# Patient Record
Sex: Female | Born: 1960
Health system: Southern US, Community
[De-identification: ages and names within clinical notes are randomized; demographics above are authoritative.]

## PROBLEM LIST (undated history)

## (undated) DIAGNOSIS — G8929 Other chronic pain: Secondary | ICD-10-CM

## (undated) DIAGNOSIS — K0889 Other specified disorders of teeth and supporting structures: Secondary | ICD-10-CM

## (undated) DIAGNOSIS — Z9889 Other specified postprocedural states: Secondary | ICD-10-CM

## (undated) DIAGNOSIS — M549 Dorsalgia, unspecified: Secondary | ICD-10-CM

## (undated) DIAGNOSIS — G5602 Carpal tunnel syndrome, left upper limb: Secondary | ICD-10-CM

## (undated) DIAGNOSIS — M1990 Unspecified osteoarthritis, unspecified site: Secondary | ICD-10-CM

## (undated) DIAGNOSIS — S52502A Unspecified fracture of the lower end of left radius, initial encounter for closed fracture: Secondary | ICD-10-CM

## (undated) DIAGNOSIS — F329 Major depressive disorder, single episode, unspecified: Secondary | ICD-10-CM

## (undated) DIAGNOSIS — R112 Nausea with vomiting, unspecified: Secondary | ICD-10-CM

## (undated) DIAGNOSIS — I10 Essential (primary) hypertension: Secondary | ICD-10-CM

## (undated) DIAGNOSIS — F32A Depression, unspecified: Secondary | ICD-10-CM

## (undated) DIAGNOSIS — K219 Gastro-esophageal reflux disease without esophagitis: Secondary | ICD-10-CM

## (undated) HISTORY — PX: TEMPOROMANDIBULAR JOINT SURGERY: SHX35

## (undated) HISTORY — PX: ELBOW SURGERY: SHX618

## (undated) HISTORY — PX: HYSTEROSCOPY: SHX211

## (undated) HISTORY — PX: DILATION AND CURETTAGE OF UTERUS: SHX78

## (undated) HISTORY — PX: MANDIBLE FRACTURE SURGERY: SHX706

## (undated) HISTORY — PX: TONSILLECTOMY: SUR1361

## (undated) HISTORY — PX: NASAL SINUS SURGERY: SHX719

## (undated) HISTORY — PX: FOOT SURGERY: SHX648

---

## 1998-06-16 ENCOUNTER — Other Ambulatory Visit: Admission: RE | Admit: 1998-06-16 | Discharge: 1998-06-16 | Payer: Self-pay | Admitting: Obstetrics and Gynecology

## 1999-10-06 ENCOUNTER — Other Ambulatory Visit: Admission: RE | Admit: 1999-10-06 | Discharge: 1999-10-06 | Payer: Self-pay | Admitting: Obstetrics and Gynecology

## 2003-08-25 ENCOUNTER — Emergency Department (HOSPITAL_COMMUNITY): Admission: AD | Admit: 2003-08-25 | Discharge: 2003-08-25 | Payer: Self-pay | Admitting: Family Medicine

## 2004-01-08 ENCOUNTER — Other Ambulatory Visit: Admission: RE | Admit: 2004-01-08 | Discharge: 2004-01-08 | Payer: Self-pay | Admitting: Obstetrics and Gynecology

## 2005-08-22 ENCOUNTER — Other Ambulatory Visit: Admission: RE | Admit: 2005-08-22 | Discharge: 2005-08-22 | Payer: Self-pay | Admitting: Obstetrics and Gynecology

## 2005-09-26 ENCOUNTER — Encounter: Admission: RE | Admit: 2005-09-26 | Discharge: 2005-09-26 | Payer: Self-pay | Admitting: Obstetrics and Gynecology

## 2005-10-25 ENCOUNTER — Encounter: Admission: RE | Admit: 2005-10-25 | Discharge: 2005-10-25 | Payer: Self-pay | Admitting: Obstetrics and Gynecology

## 2005-11-08 ENCOUNTER — Ambulatory Visit: Payer: Self-pay | Admitting: Otolaryngology

## 2005-11-09 ENCOUNTER — Emergency Department: Payer: Self-pay | Admitting: Emergency Medicine

## 2005-11-24 ENCOUNTER — Encounter (INDEPENDENT_AMBULATORY_CARE_PROVIDER_SITE_OTHER): Payer: Self-pay | Admitting: *Deleted

## 2005-11-24 ENCOUNTER — Ambulatory Visit (HOSPITAL_COMMUNITY): Admission: RE | Admit: 2005-11-24 | Discharge: 2005-11-24 | Payer: Self-pay | Admitting: Obstetrics and Gynecology

## 2005-12-01 ENCOUNTER — Ambulatory Visit: Payer: Self-pay | Admitting: Otolaryngology

## 2006-01-04 ENCOUNTER — Other Ambulatory Visit (HOSPITAL_COMMUNITY): Admission: RE | Admit: 2006-01-04 | Discharge: 2006-02-03 | Payer: Self-pay | Admitting: Psychiatry

## 2006-01-04 ENCOUNTER — Ambulatory Visit: Payer: Self-pay | Admitting: Psychiatry

## 2006-05-16 ENCOUNTER — Emergency Department (HOSPITAL_COMMUNITY): Admission: EM | Admit: 2006-05-16 | Discharge: 2006-05-16 | Payer: Self-pay | Admitting: Emergency Medicine

## 2007-07-10 IMAGING — RF DG BARIUM SWALLOW
1 series · 15 of 24 positions shown · non-contrast
Comparison: none

REASON FOR EXAM: VOMITING. ESOPHAGEAL REFLUX
COMMENTS:

PROCEDURE:     FL  - FL BARIUM SWALLOW  - November 08, 2005  [DATE]
RESULT:     Esophageal mucosal pattern, peristaltic activity and contour are
normal.  No esophageal mass lesions are noted.  No evidence of hiatal hernia
or reflux.

[Series 1: run · 12 acquisitions, 15 frames shown]
[im 1/12]
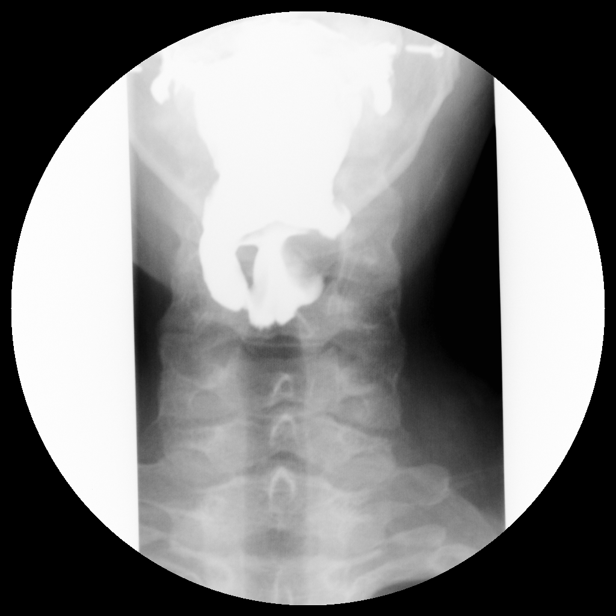
[im 1/12]
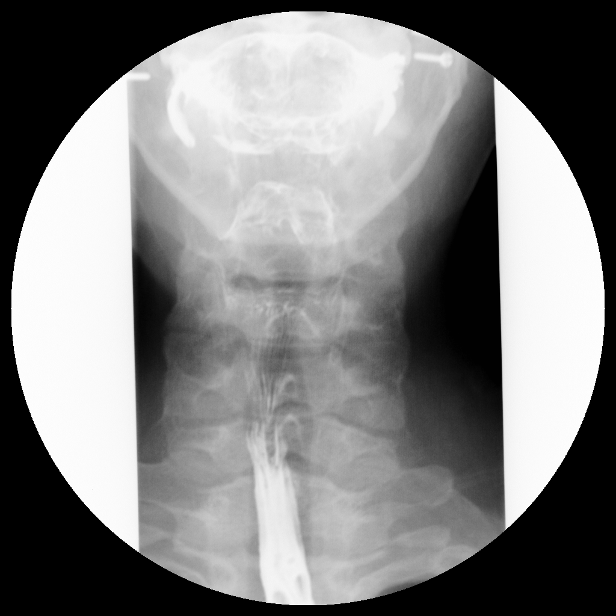
[im 2/12]
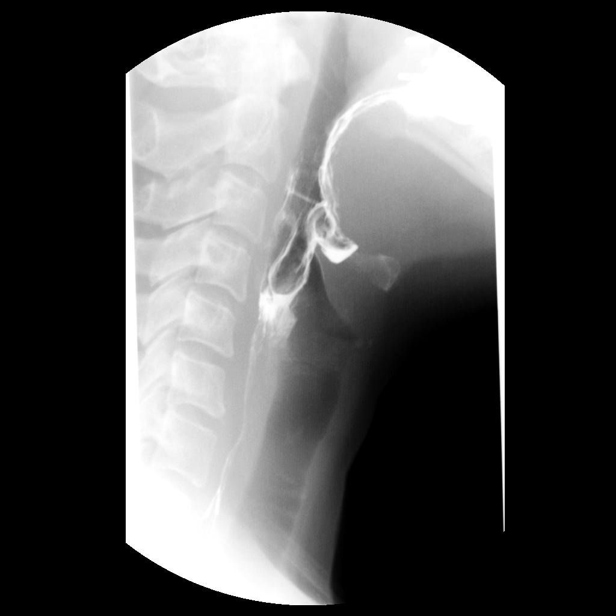
[im 2/12]
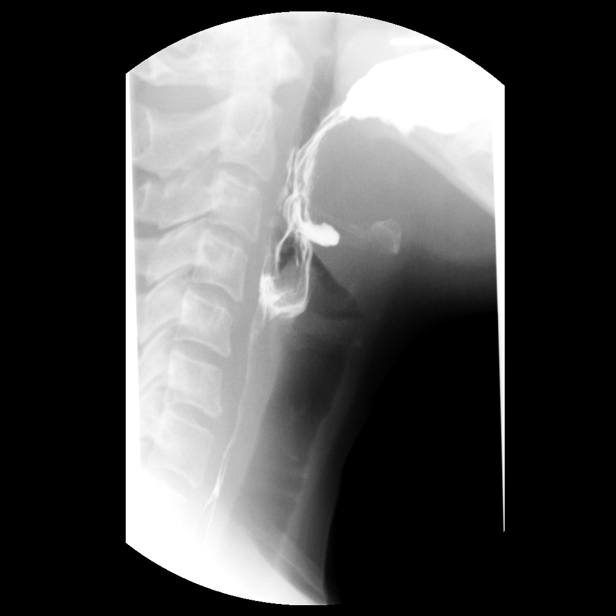
[im 2/12]
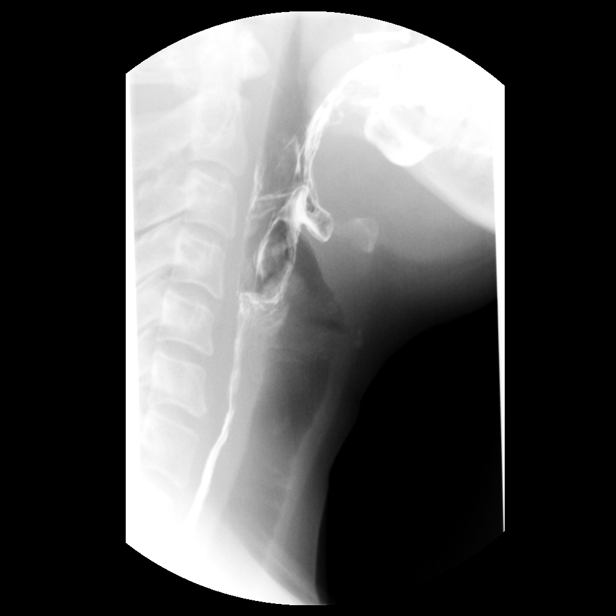
[im 3/12]
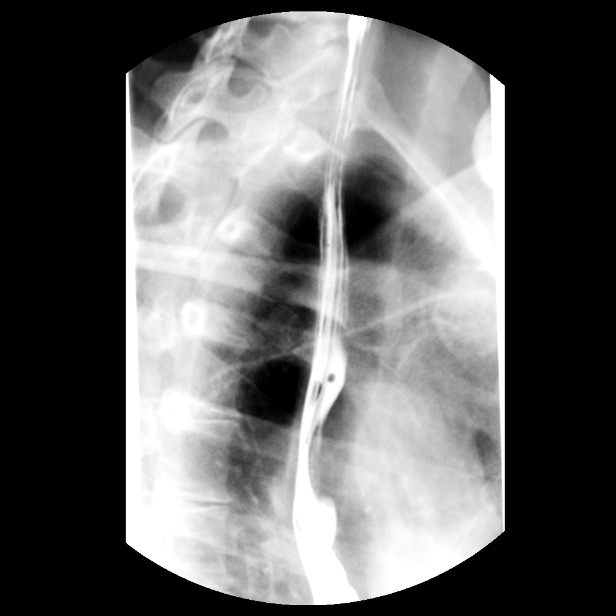
[im 4/12]
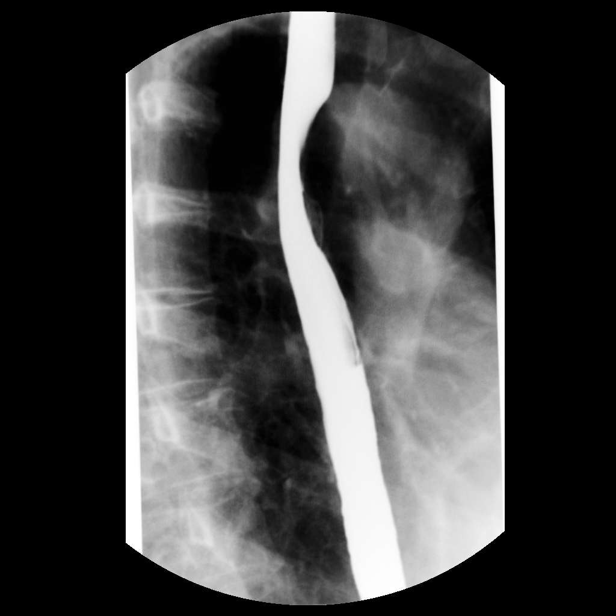
[im 6/12]
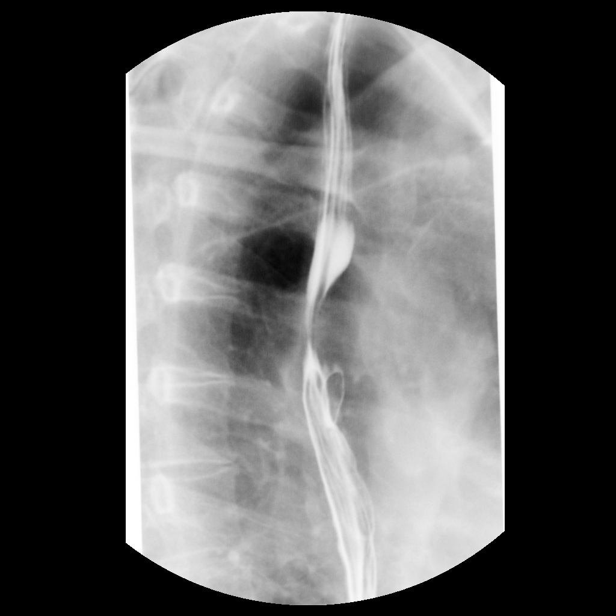
[im 7/12]
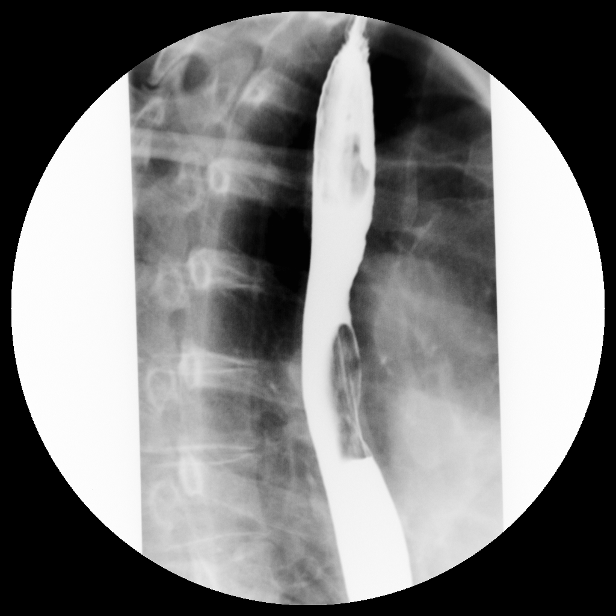
[im 8/12]
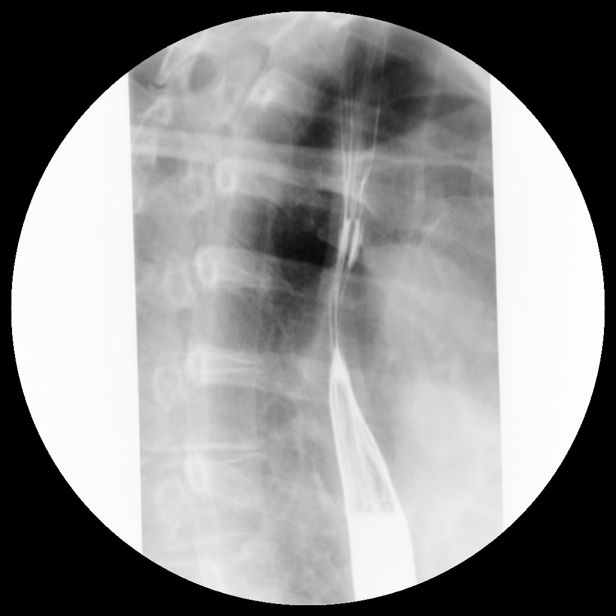
[im 8/12]
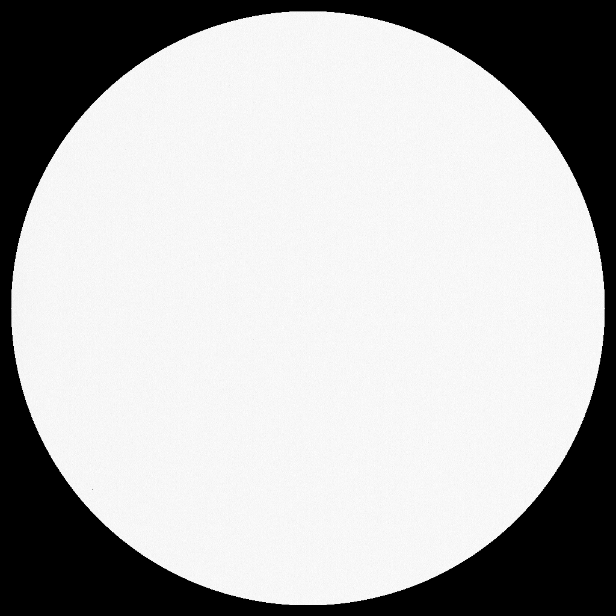
[im 9/12]
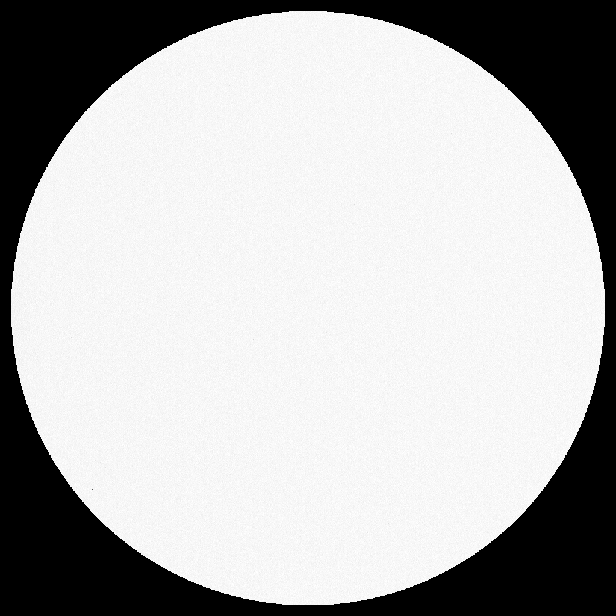
[im 10/12]
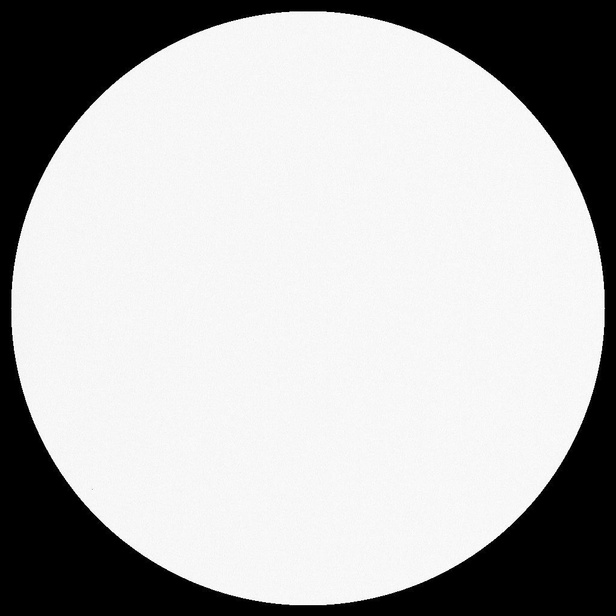
[im 11/12]
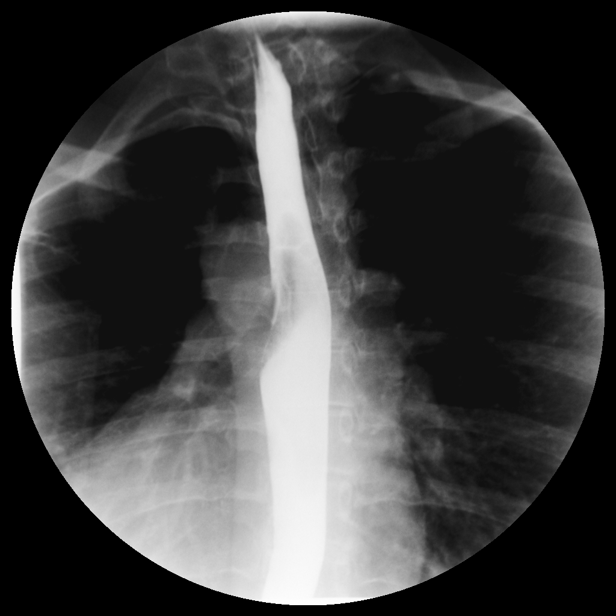
[im 12/12]
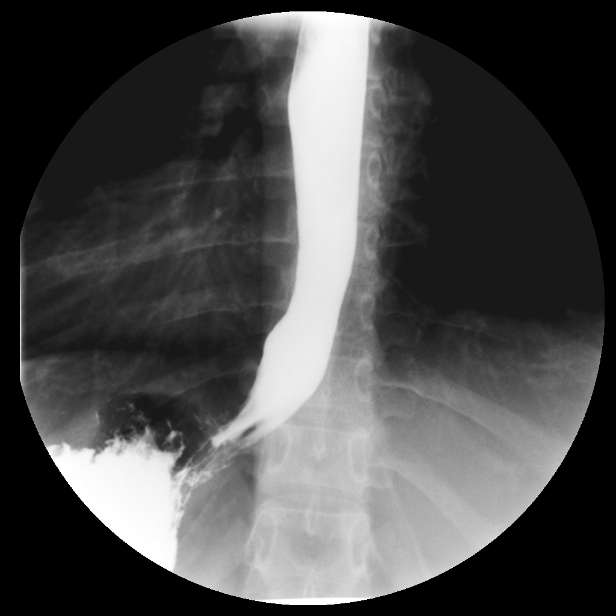

[15 of 24 positions shown; findings below may reference images not displayed]

IMPRESSION: Negative exam.

## 2008-11-24 ENCOUNTER — Encounter: Admission: RE | Admit: 2008-11-24 | Discharge: 2008-11-24 | Payer: Self-pay | Admitting: Orthopedic Surgery

## 2008-12-08 ENCOUNTER — Encounter: Admission: RE | Admit: 2008-12-08 | Discharge: 2008-12-08 | Payer: Self-pay | Admitting: Orthopedic Surgery

## 2008-12-22 ENCOUNTER — Encounter: Admission: RE | Admit: 2008-12-22 | Discharge: 2008-12-22 | Payer: Self-pay | Admitting: Orthopedic Surgery

## 2010-05-06 ENCOUNTER — Encounter: Admission: RE | Admit: 2010-05-06 | Discharge: 2010-05-06 | Payer: Self-pay | Admitting: Orthopedic Surgery

## 2010-05-12 ENCOUNTER — Observation Stay (HOSPITAL_COMMUNITY): Admission: EM | Admit: 2010-05-12 | Discharge: 2010-05-15 | Payer: Self-pay | Admitting: Emergency Medicine

## 2010-05-12 ENCOUNTER — Emergency Department (HOSPITAL_COMMUNITY): Admission: EM | Admit: 2010-05-12 | Discharge: 2010-05-12 | Payer: Self-pay | Admitting: Emergency Medicine

## 2010-05-20 ENCOUNTER — Ambulatory Visit (HOSPITAL_BASED_OUTPATIENT_CLINIC_OR_DEPARTMENT_OTHER): Admission: RE | Admit: 2010-05-20 | Discharge: 2010-05-21 | Payer: Self-pay | Admitting: Orthopedic Surgery

## 2010-10-10 ENCOUNTER — Encounter: Payer: Self-pay | Admitting: Obstetrics and Gynecology

## 2010-12-03 LAB — BASIC METABOLIC PANEL
BUN: 12 mg/dL (ref 6–23)
CO2: 29 mEq/L (ref 19–32)
Calcium: 9.4 mg/dL (ref 8.4–10.5)
Chloride: 103 mEq/L (ref 96–112)
Creatinine, Ser: 0.6 mg/dL (ref 0.4–1.2)
GFR calc Af Amer: >60 mL/min (ref >60)
GFR calc non Af Amer: >60 mL/min (ref >60)
Glucose, Bld: 103 mg/dL — ABNORMAL HIGH (ref 70–99)
Potassium: 4.2 mEq/L (ref 3.5–5.1)
Sodium: 137 mEq/L (ref 135–145)

## 2010-12-03 LAB — DIFFERENTIAL
Basophils Absolute: 0.1 10*3/uL (ref 0.0–0.1)
Basophils Relative: 1 % (ref 0–1)
Eosinophils Absolute: 0.2 10*3/uL (ref 0.0–0.7)
Eosinophils Relative: 3 % (ref 0–5)
Lymphocytes Relative: 23 % (ref 12–46)
Lymphs Abs: 1.6 10*3/uL (ref 0.7–4.0)
Monocytes Absolute: 0.6 10*3/uL (ref 0.1–1.0)
Monocytes Relative: 8 % (ref 3–12)
Neutro Abs: 4.7 10*3/uL (ref 1.7–7.7)
Neutrophils Relative %: 65 % (ref 43–77)

## 2010-12-03 LAB — CBC
HCT: 38.1 % (ref 36.0–46.0)
Hemoglobin: 13 g/dL (ref 12.0–15.0)
MCH: 33.6 pg (ref 26.0–34.0)
MCHC: 34.1 g/dL (ref 30.0–36.0)
MCV: 98.4 fL (ref 78.0–100.0)
Platelets: 301 10*3/uL (ref 150–400)
RBC: 3.87 MIL/uL (ref 3.87–5.11)
RDW: 12.6 % (ref 11.5–15.5)
WBC: 7.1 10*3/uL (ref 4.0–10.5)

## 2011-02-04 NOTE — Op Note (Signed)
[name], Alicia Rice                    ACCOUNT NO.:  [id number]   MEDICAL RECORD NO.:  [id number]          PATIENT TYPE:  AMB   LOCATION:  [facility]                           FACILITY:  [facility]   PHYSICIAN:  Hal Morales, M.D.DATE OF BIRTH:  1960/09/24   DATE OF PROCEDURE:  11/24/2005  DATE OF DISCHARGE:                                 OPERATIVE REPORT   PREOPERATIVE DIAGNOSIS:  Menorrhagia.   POSTOPERATIVE DIAGNOSIS:  Menorrhagia.   OPERATION:  Hysteroscopy, polyp removal and NovaSure endometrial ablation.   SURGEON:  Hal Morales, M.D.   ANESTHESIA:  Monitored anesthesia care and local.   ESTIMATED BLOOD LOSS:  Less than 25 mL.   COMPLICATIONS:  None.   FINDINGS:  The uterus sounded to 7 cm with a 2.5 cm cervical length. At the  time of hysteroscopy a small apparent polyp on the anterior surface of the  uterus was noted and this was removed with Randall stone forceps.   PROCEDURE:  The patient was taken to the operating room after appropriate  identification and placed on the operating table. After placement of  equipment for monitored anesthesia care, she was placed in the lithotomy  position. The perineum and vagina were prepped with multiple layers of  Betadine and draped in sterile field. A red Robinson catheter was used to  empty the bladder. A Graves speculum was placed in the vagina and a single-  tooth tenaculum placed on the anterior cervix. Paracervical block was  achieved with a total of 10 mL of 2% Xylocaine in the 5 and 7 o'clock  positions. The uterus was sounded. The cervix was measured. The cervix was  noted to be dilated enough to accept the diagnostic hysteroscope, and it was  used to document the above-noted findings. The Randall stone forceps were  used to remove the aforementioned polypoid lesion. The ostia were both  identified. Before the ostia could be both identified, suction of some of  the endometrial contents was undertaken with a #7 suction  curette. Once both  ostia were identified and the polypoid lesion had been removed, the NovaSure  apparatus was placed into the endometrial cavity with the endometrial length  of 4.5 cm having been set on the apparatus. The array was opened and seated  in the cavity with a maximum cavity width of 2.8 cm. Vaseline gauze was used  to seal the cervical opening and test for integrity of the cavity undertaken  and passed. The NovaSure ablation was then begun with a power of 69 in a  time of 1 minute 18 seconds. The NovaSure array was replaced into the  aperture and the entire instrument removed from the vagina. The tenaculum  was removed from the cervix and a suture placed in the  cervix to allow hemostasis at the site of the tenaculum stick. All  instruments were then removed from the vagina and the patient taken from the  operating room to the recovery room in satisfactory condition having  tolerated the procedure well with sponge and instrument counts correct.  Hal Morales, M.D.  Electronically Signed     VPH/MEDQ  D:  11/24/2005  T:  11/25/2005  Job:  [id number]

## 2011-02-04 NOTE — H&P (Signed)
NAMEERIKO, Rice                    ACCOUNT NO.:  [id number]   MEDICAL RECORD NO.:  [id number]          PATIENT TYPE:  AMB   LOCATION:  [facility]                           FACILITY:  [facility]   PHYSICIAN:  Hal Morales, M.D.DATE OF BIRTH:  04-26-1961   DATE OF ADMISSION:  DATE OF DISCHARGE:                                HISTORY & PHYSICAL   HISTORY OF PRESENT ILLNESS:  Ms.  Alicia Rice is a 50 year old married white female,  para 2-0-1-2, who presents for NovaSure endometrial ablation because of  menorrhagia.  For the past three years three of the patient's 7-day  menstrual flow requires a change of a tampon with a pad every 90 minutes,  throughout which time she also experiences clots and occasionally will soil  her clothing.  The patient has also experienced cramping which she rates as  7/10 on a 10-point scale, but finds adequate relief with Aleve.  A  sonohysterogram in May of 2005 was within normal limits and did not  demonstrate any submucosal fibroids or endometrial lesions.  A TSH and CBC  at that time were also normal.  Since the patient is intolerant of oral  contraceptives to manage her symptoms due to her history of hypertension and  migraines, she has chosen to undergo endometrial ablation to manage her  bleeding.   PAST MEDICAL/OB HISTORY:  Gravida 4, para 2-0-2-2.   GYN HISTORY:  Menarche 50 years old.  Last menstrual period November 09, 2005.  The patient uses vasectomy as her method of contraception.  She  denies any history of abnormal Pap smear or sexually transmitted diseases.  The patient's last mammogram was January of 2007 which was accompanied by an  ultrasound, during which time an abnormality was discovered for which the  patient will follow up in six months (nonspecific abnormality of the  breast).  Pap smear December 2006 was within normal limits.   PAST MEDICAL HISTORY:  1.  Hypertension.  2.  Migraines.  3.  Depression.  4.  Anxiety.  5.  Gastroesophageal  reflux disease.  6.  Attention deficit disorder.   PAST SURGICAL HISTORY:  1.  1965, 1968, and 1974, urethral stretching.  2.  1981, tonsillectomy.  3.  1991 and 1993, TMJ surgery.  4.  2005, right elbow surgery.  The patient states that anesthesia causes      her severe nausea and vomiting.  She denies any history of blood      transfusions.   FAMILY HISTORY:  Graves' disease, hypertension, noninsulin dependent  diabetes, and asthma.   SOCIAL HISTORY:  She is married and she works as a IT consultant.   HABITS:  She does not use tobacco or alcohol.   CURRENT MEDICATIONS:  1.  Wellbutrin XL 300 mg one tablet every morning.  2.  Quinapril 10 mg one tablet every morning.  3.  Adderall XR 30 mg one tablet every morning.  4.  Ranitidine 150 mg one tablet twice daily.  5.  AcipHex 20 mg one tablet twice daily.  6.  Lexapro 20 mg one tablet  at bedtime.  7.  Ambien CR 12.5 mg one tablet as needed.  8.  Alprazolam 1 mg one half tablet four times daily as needed.   ALLERGIES:  The patient has no known drug allergies.   REVIEW OF SYSTEMS:  The patient does have insomnia.  She denies any  vaginitis symptoms, fever, urinary tract symptoms, change in bowel habits,  or dyspareunia, and otherwise except as mentioned in history of present  illness, her review of systems is negative.   PHYSICAL EXAMINATION:  VITAL SIGNS:  Blood pressure 120/74, pulse is 80 and  regular, height 5 feet 6 inches tall, weight 133 pounds.  NECK:  Supple without masses.  There is no thyromegaly or adenopathy.  HEART:  Regular rate and rhythm.  There is no murmur.  LUNGS:  Clear to auscultation.  There are no wheezes, rales, or rhonchi.  BACK:  No CVA tenderness.  ABDOMEN:  Bowel sounds are present.  It is soft without tenderness,  guarding, rebound, or organomegaly.  EXTREMITIES:  Without clubbing, cyanosis, or edema.  PELVIC:  EGBUS is within normal limits.  Vagina is rugous.  Cervix is  nontender without lesions.   Uterus appears normal size, shape, and  consistency without tenderness.  Her uterus sounds to 9 cm.  Adnexa without  tenderness or masses.  Rectovaginal without tenderness or masses.  An  endometrial biopsy was performed at the time of the patient's preoperative  visit.  The pathology report revealed benign early secretory phase  endometrium.  No hyperplasia identified.   IMPRESSION:  Menorrhagia.   DISPOSITION:  A discussion was held with the patient regarding the  indications for her NovaSure endometrial ablation procedure along with the  risks which include, but are not limited to, reaction to anesthesia, damage  to adjacent organs, excessive bleeding, and infection.  The patient has  consented to proceed with the endometrial ablation procedure at [facility] [place] on November 24, 2005, at 11 a.m.      [name].      Hal Morales, M.D.  Electronically Signed    EJP/MEDQ  D:  11/22/2005  T:  11/22/2005  Job:  [id number]

## 2012-02-22 ENCOUNTER — Encounter (HOSPITAL_COMMUNITY): Payer: Self-pay | Admitting: Emergency Medicine

## 2012-02-22 ENCOUNTER — Emergency Department (HOSPITAL_COMMUNITY)
Admission: EM | Admit: 2012-02-22 | Discharge: 2012-02-23 | Disposition: A | Discharge status: Home / Self Care | Payer: BC Managed Care – PPO | Attending: Emergency Medicine | Admitting: Emergency Medicine

## 2012-02-22 DIAGNOSIS — R1031 Right lower quadrant pain: Secondary | ICD-10-CM | POA: Insufficient documentation

## 2012-02-22 DIAGNOSIS — K5730 Diverticulosis of large intestine without perforation or abscess without bleeding: Secondary | ICD-10-CM | POA: Insufficient documentation

## 2012-02-22 DIAGNOSIS — X503XXA Overexertion from repetitive movements, initial encounter: Secondary | ICD-10-CM | POA: Insufficient documentation

## 2012-02-22 DIAGNOSIS — IMO0002 Reserved for concepts with insufficient information to code with codable children: Secondary | ICD-10-CM | POA: Insufficient documentation

## 2012-02-22 DIAGNOSIS — H9190 Unspecified hearing loss, unspecified ear: Secondary | ICD-10-CM | POA: Insufficient documentation

## 2012-02-22 DIAGNOSIS — H9319 Tinnitus, unspecified ear: Secondary | ICD-10-CM | POA: Insufficient documentation

## 2012-02-22 DIAGNOSIS — Z79899 Other long term (current) drug therapy: Secondary | ICD-10-CM | POA: Insufficient documentation

## 2012-02-22 DIAGNOSIS — I10 Essential (primary) hypertension: Secondary | ICD-10-CM | POA: Insufficient documentation

## 2012-02-22 DIAGNOSIS — S39011A Strain of muscle, fascia and tendon of abdomen, initial encounter: Secondary | ICD-10-CM

## 2012-02-22 HISTORY — DX: Essential (primary) hypertension: I10

## 2012-02-22 LAB — CBC
HCT: 40.6 % (ref 36.0–46.0)
Hemoglobin: 14.1 g/dL (ref 12.0–15.0)
MCH: 33 pg (ref 26.0–34.0)
MCHC: 34.7 g/dL (ref 30.0–36.0)
MCV: 95.1 fL (ref 78.0–100.0)
Platelets: 294 10*3/uL (ref 150–400)
RBC: 4.27 MIL/uL (ref 3.87–5.11)
RDW: 14 % (ref 11.5–15.5)
WBC: 8 10*3/uL (ref 4.0–10.5)

## 2012-02-22 LAB — DIFFERENTIAL
Basophils Absolute: 0.1 10*3/uL (ref 0.0–0.1)
Basophils Relative: 1 % (ref 0–1)
Eosinophils Absolute: 0.2 10*3/uL (ref 0.0–0.7)
Eosinophils Relative: 3 % (ref 0–5)
Lymphocytes Relative: 32 % (ref 12–46)
Lymphs Abs: 2.6 10*3/uL (ref 0.7–4.0)
Monocytes Absolute: 0.6 10*3/uL (ref 0.1–1.0)
Monocytes Relative: 7 % (ref 3–12)
Neutro Abs: 4.6 10*3/uL (ref 1.7–7.7)
Neutrophils Relative %: 58 % (ref 43–77)

## 2012-02-22 NOTE — ED Notes (Signed)
PT. REPORTS RIGHT LOWER BACK AND RLQ PAIN ONSET YESTERDAY AFTER PICKING UP A BOX , STATES " STATES FELT FUNNY".  SLIGHT NAUSEA .

## 2012-02-23 ENCOUNTER — Emergency Department (HOSPITAL_COMMUNITY): Payer: BC Managed Care – PPO

## 2012-02-23 LAB — BASIC METABOLIC PANEL
BUN: 28 mg/dL — ABNORMAL HIGH (ref 6–23)
CO2: 26 mEq/L (ref 19–32)
Calcium: 9.3 mg/dL (ref 8.4–10.5)
Chloride: 102 mEq/L (ref 96–112)
Creatinine, Ser: 0.83 mg/dL (ref 0.50–1.10)
GFR calc Af Amer: >90 mL/min (ref >90)
GFR calc non Af Amer: 81 mL/min — ABNORMAL LOW (ref >90)
Glucose, Bld: 117 mg/dL — ABNORMAL HIGH (ref 70–99)
Potassium: 3.7 mEq/L (ref 3.5–5.1)
Sodium: 141 mEq/L (ref 135–145)

## 2012-02-23 LAB — URINALYSIS, ROUTINE W REFLEX MICROSCOPIC
Bilirubin Urine: NEGATIVE
Glucose, UA: NEGATIVE mg/dL
Hgb urine dipstick: NEGATIVE
Ketones, ur: NEGATIVE mg/dL
Leukocytes, UA: NEGATIVE
Nitrite: NEGATIVE
Protein, ur: NEGATIVE mg/dL
Specific Gravity, Urine: 1.029 (ref 1.005–1.030)
Urobilinogen, UA: 0.2 mg/dL (ref 0.0–1.0)
pH: 6 (ref 5.0–8.0)

## 2012-02-23 MED ORDER — POLYETHYLENE GLYCOL 3350 17 G PO PACK: 17.0000 g | PACK | Freq: Every day | ORAL | Status: AC

## 2012-02-23 MED ORDER — IOHEXOL 300 MG/ML  SOLN
100.0000 mL | Freq: Once | INTRAMUSCULAR | Status: AC | PRN
  Administered 2012-02-23: 100 mL via INTRAVENOUS

## 2012-02-23 MED ORDER — DIAZEPAM 5 MG PO TABS: 5.0000 mg | ORAL_TABLET | Freq: Three times a day (TID) | ORAL | Status: AC | PRN

## 2012-02-23 MED ORDER — IOHEXOL 300 MG/ML  SOLN
20.0000 mL | INTRAMUSCULAR | Status: AC
  Administered 2012-02-23: 20 mL via ORAL

## 2012-02-23 MED ORDER — MORPHINE SULFATE 4 MG/ML IJ SOLN
4.0000 mg | Freq: Once | INTRAMUSCULAR | Status: AC
  Administered 2012-02-23: 4 mg via INTRAVENOUS
  Filled 2012-02-23: qty 1

## 2012-02-23 MED ORDER — IBUPROFEN 800 MG PO TABS: 800.0000 mg | ORAL_TABLET | Freq: Three times a day (TID) | ORAL | Status: AC

## 2012-02-23 MED ORDER — OXYCODONE-ACETAMINOPHEN 5-325 MG PO TABS: 2.0000 | ORAL_TABLET | ORAL | Status: AC | PRN

## 2012-02-23 MED ORDER — DIAZEPAM 5 MG PO TABS
5.0000 mg | ORAL_TABLET | Freq: Once | ORAL | Status: AC
  Administered 2012-02-23: 5 mg via ORAL
  Filled 2012-02-23: qty 1

## 2012-02-23 MED ORDER — KETOROLAC TROMETHAMINE 30 MG/ML IJ SOLN
30.0000 mg | Freq: Once | INTRAMUSCULAR | Status: AC
  Administered 2012-02-23: 30 mg via INTRAVENOUS
  Filled 2012-02-23: qty 1

## 2012-02-23 NOTE — ED Notes (Signed)
Pt c/o constant back pain that radiates to RLQ. Pain relived by nothing, and exacerbated by nothing. Denies difficulty urinating, or hx of kidney stones. States pain started after lifting objects.

## 2012-02-23 NOTE — ED Provider Notes (Signed)
History     CSN: [id number]  Arrival date & time 02/22/12  2312   First MD Initiated Contact with Patient 02/23/12 0252      Chief Complaint  Patient presents with  . Back Pain    (Consider location/radiation/quality/duration/timing/severity/associated sxs/prior treatment) Patient is a 51 y.o. female presenting with back pain.  Back Pain    51 year old female presents emergency department with right back and right lower quadrant abdominal pain. Patient reports sprains started yesterday after picking up a box weighing approximately 53 pounds. Patient reports when she lifted the box she also did some twisting to get moved where needed to go. Patient had some pain at that time, but since that episode of lifting and twisting she has had worsening pain. Pain is worse with flexion at her right hip, and with any twisting motion. Patient has taken ibuprofen without improvement in symptoms. No prior history of similar symptoms. Patient has noticed no bulging or deformity to the area of pain Past Medical History  Diagnosis Date  . Hypertension     Past Surgical History  Procedure Date  . Foot surgery   . Mandible fracture surgery     No family history on file.  History  Substance Use Topics  . Smoking status: Current Everyday Smoker  . Smokeless tobacco: Not on file  . Alcohol Use: No    OB History    Grav Para Term Preterm Abortions TAB SAB Ect Mult Living                  Review of Systems  HENT: Positive for hearing loss and tinnitus.   Musculoskeletal: Positive for back pain.  All other systems reviewed and are negative.   other than listed in the history of present illness  Allergies  Review of patient's allergies indicates no known allergies.  Home Medications   Current Outpatient Rx  Name Route Sig Dispense Refill  . RANITIDINE HCL 150 MG PO CAPS Oral Take 150 mg by mouth daily.    . SERTRALINE HCL 100 MG PO TABS Oral Take 100 mg by mouth daily.    Marland Kitchen DIAZEPAM  5 MG PO TABS Oral Take 1 tablet (5 mg total) by mouth every 8 (eight) hours as needed for anxiety (muscle spasm). 15 tablet 0  . IBUPROFEN 800 MG PO TABS Oral Take 1 tablet (800 mg total) by mouth 3 (three) times daily. 21 tablet 0  . OXYCODONE-ACETAMINOPHEN 5-325 MG PO TABS Oral Take 2 tablets by mouth every 4 (four) hours as needed for pain. 20 tablet 0  . POLYETHYLENE GLYCOL 3350 PO PACK Oral Take 17 g by mouth daily. 14 each 0    BP 150/93  Pulse 60  Temp(Src) 97.7 F (36.5 C) (Oral)  Resp 16  SpO2 99%  Physical Exam  Nursing note and vitals reviewed. Constitutional: She is oriented to person, place, and time. She appears well-developed and well-nourished.  HENT:  Head: Normocephalic and atraumatic.  Right Ear: External ear normal.  Left Ear: External ear normal.  Nose: Nose normal.  Mouth/Throat: Oropharynx is clear and moist.  Eyes: Conjunctivae and EOM are normal. Pupils are equal, round, and reactive to light.  Neck: Normal range of motion. Neck supple. No JVD present. No tracheal deviation present. No thyromegaly present.  Cardiovascular: Normal rate, regular rhythm, normal heart sounds and intact distal pulses.  Exam reveals no gallop and no friction rub.   No murmur heard. Pulmonary/Chest: Effort normal and breath sounds normal. No  stridor. No respiratory distress. She has no wheezes. She has no rales. She exhibits no tenderness.  Abdominal: Soft. Bowel sounds are normal. She exhibits no distension and no mass. There is tenderness (tenderness with palpation and right lower quadrant. No masses or hernias noted, but slight irregularity to palpation in her right lower abdomen.). There is no rebound and no guarding.  Musculoskeletal: Normal range of motion. She exhibits tenderness (mild tenderness of right paraspinal muscles, tenderness with straight leg raise of right leg in her right lower abdomen). She exhibits no edema.  Lymphadenopathy:    She has no cervical adenopathy.    Neurological: She is oriented to person, place, and time. She has normal reflexes. No cranial nerve deficit. She exhibits normal muscle tone. Coordination normal.  Skin: Skin is dry. No rash noted. No erythema. No pallor.  Psychiatric: She has a normal mood and affect. Her behavior is normal. Judgment and thought content normal.    ED Course  Procedures (including critical care time)  Labs Reviewed  BASIC METABOLIC PANEL - Abnormal; Notable for the following:    Glucose, Bld 117 (*)    BUN 28 (*)    GFR calc non Af Amer 81 (*)    All other components within normal limits  URINALYSIS, ROUTINE W REFLEX MICROSCOPIC  CBC  DIFFERENTIAL  CBC   Ct Abdomen Pelvis W Contrast  02/23/2012  *RADIOLOGY REPORT*  Clinical Data: Lower quadrant abdominal pain.  CT ABDOMEN AND PELVIS WITH CONTRAST  Technique:  Multidetector CT imaging of the abdomen and pelvis was performed following the standard protocol during bolus administration of intravenous contrast.  Contrast: OMNIPAQUE IOHEXOL 300 MG/ML  SOLN  Comparison: None  Findings: The lung bases are clear.  A cystic air space is noted on the left.  A 4 mm nodule is noted at the left lung base on image number 11.  No other pulmonary nodules.  No pleural effusion.  The liver is unremarkable.  No focal lesions or biliary dilatation. The gallbladder is normal.  No common bile duct dilatation.  The pancreas is normal.  The spleen is normal.  There is a 17 mm solid appearing nodule in the left upper quadrant located between the lateral limb of the adrenal gland, the spleen and the posterior aspect of the fundal region of the stomach.  This could be a gastric diverticulum or an exophytic splenic or adrenal gland lesion.  The right adrenal gland and both kidneys are normal.  A left renal cyst is noted.  The stomach, duodenum, small bowel and colon are unremarkable. There is moderate stool throughout the colon.  Diverticulosis of the sigmoid colon is noted.  No  mesenteric or retroperitoneal mass or adenopathy.  The aorta is normal in caliber.  The major branch vessels are patent.  The uterus and ovaries are normal.  The bladder is normal.  No pelvic mass, adenopathy or free pelvic fluid collections.  No inguinal mass or hernia.  The bony structures are unremarkable.  IMPRESSION:  1.  4 mm left lower lobe pulmonary nodule.  Recommend noncontrast chest CT follow-up in 6 months. 2.  17 mm left upper quadrant lesion as discussed above.  This could also be followed on the follow up CT scan. 3.  Diverticulosis of the sigmoid colon but no findings for diverticulitis.  Original Report Authenticated By: P. Loralie Champagne, M.D.     1. Abdominal muscle strain       MDM  51 year old female with right lower  abdominal pain concerning for possible hernia versus muscle strain. We'll get CT of abdomen pelvis. Patient without focal neurologic deficits or red flags for significant back pain. Will treat for pain with Valium and Toradol and morphine.        Olivia Mackie, MD 02/23/12 [phone]

## 2012-02-23 NOTE — Discharge Instructions (Signed)
Muscle Spasm  Take medications as prescribed.  Warm moist heat, either from warm bath, hot water bottle, or heating pad to the spasm area will help with symptoms.  Expect to be sore for 7-10 days.  Follow up with your regular doctor.  If you do not have a doctor, follow up with one of the numbers listed.  Return to the ER for worsening pain, new weakness, numbness, loss of bowel or bladder control, or other concerning symptoms.  PSYCH ANXIOLYTICS BENZODIAZEPINES  PSYCH ANXIOLYTICS BENZODIAZEPINES: You have been prescribed a medication that belongs to a class called Benzodiazepines.      These medicines are used to treat anxiety (nervousness), tremors, seizures, vertigo, insomnia, nausea (especially that associated with chemotherapy), alcohol or sedative drug withdrawal, and muscle spasm; they may also be given (usually intravenously) in the ED for sedation during procedures. This medication is a "scheduled substance" that means it is against the law to share it or give it to anyone else.     You have been given a medication, or a prescription for a medication, that causes drowsiness or dizziness.  DO NOT drive a car, operate machinery, ride a bike, or perform jobs that require you to be alert until you know how you are going to react to this medicine.     Make sure your doctor knows if you have any of these conditions before you take this medication:  an alcohol or drug abuse problem, depression, glaucoma, kidney or liver disease, lung disease or breathing difficulties, myasthenia gravis, Parkinson's disease.     If you are on any of the following medications make sure that your doctor knows before you start this medication as they can cause some undesirable interactions:  Seizure medicines (used for convulsion or epilepsy), chloroquine, cimetidine (Tagamet), digoxin (Lanoxin), disulfiram (Antabuse), or erythromycin.     DO NOT drink alcoholic beverages while taking this medicine.     If you  become dizzy, sit or lie down at the first signs.  You should be careful going up and down stairs.     This medication may cause side-effects.  If they are bothersome, discontinue the medication.  If they are severe, follow-up with your physician or return to the Emergency Department for a recheck.  These side-effects include:  dizziness, depression, headaches, blurry vision, and problems sleeping. Tell your doctor if you are taking any of the following medicines:      DO NOT drink alcoholic beverages while taking this medicine.     Medications for your stomach, Cyclosporin, Medications for your heart or blood pressure, Diflucan, Theophylline, Isoniazid, Antibiotics, Migraine medicines, Medications for seizures, depression, or mental illness.     If you become dizzy, sit or lie down at the first signs.  You should be careful going up and down stairs. DO NOT take more of this medicine than prescribed.  Taking too much of this medicine can cause DEATH.      If you miss a dose do not "double up."  DO NOT take extra doses as this will not help you feel any better any faster.  It may even cause unwanted side-effects.     Contact your doctor immediately if you develop an allergic reaction, you feel lightheaded, confused, drowsy, or experience thoughts of hurting yourself or others.  Call also if you experience yellowing of the eyes or skin, or abnormal muscle twitching or movements.     DO NOT take this medication if you are pregnant or are nursing  or you are actively trying to become pregnant.     Keep this medication out of the reach of children.  Always keep this medication in child-proof containers.  DO NOT give your medication to anyone else. This drug may be habit-forming (addictive).  DO NOT use it for more than one week without discussing it with your regular doctor.  You have been given a medication, or a prescription for a medication, that causes drowsiness or dizziness.  DO NOT drive a  car, operate machinery, ride a bike, or perform jobs that require you to be alert until you know how you are going to react to this medicine.  THESE INSTRUCTIONS ARE NOT COMPREHENSIVE (complete):  Ask your pharmacist for additional information and precautions for this medication.   PAIN NSAID MOTRIN  PAIN NSAID MOTRIN: You have been given a medication that contains ibuprofen.     This medication is often used to relieve pain, reduce fever, reduce inflammation, or to help prevent the ureteral spasm and pain associated with kidney stones.    DO NOT take this medication if you  have stomach ulcers or are sensitive / allergic to ibuprofen.    DO NOT take this medication if you are taking other over-the-counter medications that contain ibuprofen.  Never take more of the medication than prescribed.  Overdosing of medication may cause damage to your kidneys.    If you have side-effects that you think are caused by this medicine, tell your doctor.  If you develop stomach pain, vomit blood, or have bowel movements that become black and tarry, discontinue the medication and notify your physician immediately.    This medication may upset your stomach.  Always take medication with milk or meals.    Keep this medication out of the reach of children.  Always keep this medication in child-proof containers.  DO NOT give your medication to anyone else. THESE INSTRUCTIONS ARE NOT COMPREHENSIVE (complete):  Ask your pharmacist for additional information and precautions for this medication.   PAIN ACETAMINOPHEN OXYCODONE  PAIN ACETAMINOPHEN OXYCODONE: You have been given a medication that contains acetaminophen and oxycodone.      This medication is used to relieve pain.     DO NOT take this medication if you have liver disease or drink alcohol on a daily basis.     DO NOT take this medication if you are taking other over-the-counter medications that contain Tylenol or acetaminophen (the active  ingredient in Tylenol).     If you have side-effects that you think are caused by this medicine, tell your doctor.     DO NOT drink alcoholic beverages while taking this medicine.     If you become dizzy, sit or lie down at the first signs.  You should be careful going up and down stairs.     If you are pregnant or breastfeeding, notify your doctor before taking this medication.     Keep this medication out of the reach of children.  Always keep this medication in child-proof containers.  DO NOT give your medication to anyone else. This medication can be HABIT-FORMING.  Discontinue use when no longer needed and never give this medication to others.  You have been given a medication, or a prescription for a medication, that causes drowsiness or dizziness.  DO NOT drive a car, operate machinery, or perform jobs that require you to be alert until you know how you are going to react to this medicine.  THESE INSTRUCTIONS ARE NOT COMPREHENSIVE (  complete):  Ask your pharmacist for additional information and precautions for this medication.   PAIN, GENERAL - WITH PAIN MEDICATION  PAIN, GENERAL: You have been seen today for treatment of your pain.  The physician who treated you did not feel that the cause of your pain was dangerous and felt it was OK to treat your symptoms.  You will be given a prescription for pain medication to treat your pain. Use the pain medication as needed for discomfort. It may be to your advantage to take the pain medications regularly, around the clock as directed for the next a few days. By doing this, you can build up a helpful amount of the medicine in your system.  YOU SHOULD SEEK MEDICAL ATTENTION IMMEDIATELY, EITHER HERE OR AT THE NEAREST EMERGENCY DEPARTMENT, IF ANY OF THE FOLLOWING OCCURS:      Your pain becomes worse, despite treatment with pain medications.     You develop any other significant symptoms.  MUSCLE STRAIN, GENERAL  MUSCLE STRAIN,  GENERAL: You have been diagnosed with a muscle strain.  Any muscle in the body can be strained. A strain is an injury to muscles where some of the muscle fibers are injured by being stretched or partially torn. This usually happens by overusing the muscle or performing an activity that the muscle is not used to doing.  Some of the symptoms of a strain include pain, muscle cramping, and soreness to the touch.  Often, the pain and stiffness in the muscle is worse the next day. This is much like what happens when a person begins exercising for the first time. After the exercise session, the person may feel pretty good, however the next day all of the exercised muscles feel stiff and sore.  The general treatment for a strain includes the following:      Resting the affected part.     Pain medication.     Muscle relaxant medications.     Warm compresses (such as a warm, moist towel).     Gentle stretching of the injured muscle.     And when tolerated, gentle massage of the affected area. This injury is self-limited (it gets better on its own) and rarely requires specific treatment.  YOU SHOULD SEEK MEDICAL ATTENTION IMMEDIATELY, EITHER HERE OR AT THE NEAREST EMERGENCY DEPARTMENT, IF ANY OF THE FOLLOWING OCCURS:      Significant increase in swelling of the affected area.     Worsening pain instead of gradual improvement.     Redness of the skin over the affected area.     Inability to use the affected limb. Weakness or numbness of the limb. Inguinal Strain Your exam shows you have an inguinal strain. This is also known as a pulled groin. This injury is usually due to a pull or partial tear to a muscle or tendon in the groin area. Most groin pulls take several weeks to heal completely. There may be pain with lifting your leg or walking during much of your recovery. Treatment for groin strains includes:  Rest and avoid lifting or performing activities that increase your pain.   Apply  ice packs for 20-30 minutes every few hours to reduce pain and swelling over the next 2-3 days.   Medicine to reduce pain and inflammation is often prescribed.  HOME CARE INSTRUCTIONS  While most strains in the groin area will heal with rest, you should also watch for any signs of a more serious condition.  SEEK IMMEDIATE MEDICAL CARE  IF:   You notice unusual swelling or bulging in the groin.   You have pain or swelling in the testicle.   Blood in your urine.   Marked increased pain.   Weakness or numbness of your leg or abdominal pain.  MAKE SURE YOU:   Understand these instructions.   Will watch your condition.   Will get help right away if you are not doing well or get worse.  Document Released: 10/13/2004 Document Revised: 08/25/2011 Document Reviewed: 01/10/2008 [facility] Patient Information 2012 [place], [place].

## 2012-04-17 LAB — TSH: TSH: 1.11 u[IU]/mL (ref 0.41–5.90)

## 2012-04-17 LAB — HEPATIC FUNCTION PANEL
ALT: 11 U/L (ref 7–35)
AST: 10 U/L — AB (ref 13–35)
Alkaline Phosphatase: 64 U/L (ref 25–125)
Bilirubin, Total: 0.2 mg/dL

## 2012-04-17 LAB — BASIC METABOLIC PANEL
BUN: 13 mg/dL (ref 4–21)
Creatinine: 0.6 mg/dL (ref 0.5–1.1)
Glucose: 96 mg/dL
Potassium: 4 mmol/L (ref 3.4–5.3)
Sodium: 138 mmol/L (ref 137–147)

## 2013-06-18 ENCOUNTER — Other Ambulatory Visit: Payer: Self-pay | Admitting: Orthopedic Surgery

## 2013-06-18 DIAGNOSIS — M545 Low back pain, unspecified: Secondary | ICD-10-CM

## 2013-06-18 DIAGNOSIS — S86309A Unspecified injury of muscle(s) and tendon(s) of peroneal muscle group at lower leg level, unspecified leg, initial encounter: Secondary | ICD-10-CM | POA: Insufficient documentation

## 2013-07-01 ENCOUNTER — Other Ambulatory Visit: Payer: BC Managed Care – PPO

## 2013-07-08 ENCOUNTER — Other Ambulatory Visit: Payer: BC Managed Care – PPO

## 2013-10-24 IMAGING — CT CT ABD-PELV W/ CM
3 of 5 series · 15 of 32 positions shown, 19 images · IV contrast (omnipaque)
Comparison: None

CLINICAL DATA: Lower quadrant abdominal pain.

CT ABDOMEN AND PELVIS WITH CONTRAST
TECHNIQUE: Multidetector CT imaging of the abdomen and pelvis was
performed following the standard protocol during bolus
administration of intravenous contrast.
Contrast: 100mL OMNIPAQUE IOHEXOL 300 MG/ML  SOLN

[Series 2: routine abdomen · axial · 0.79mm/px · z∈[-438,-204]mm · 3 of 95 slices shown, 7 images]
[im 24/95  soft-tissue]
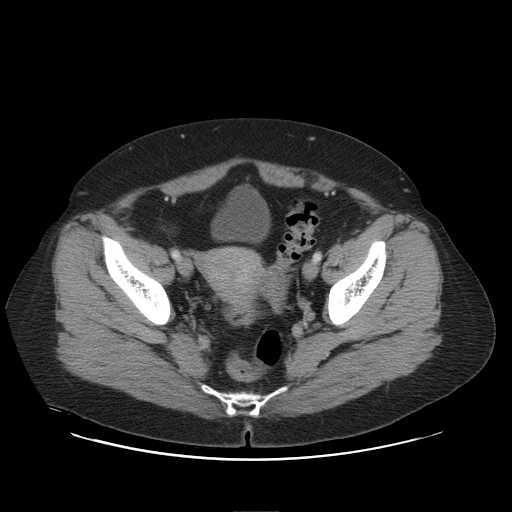
[im 24/95  lung]
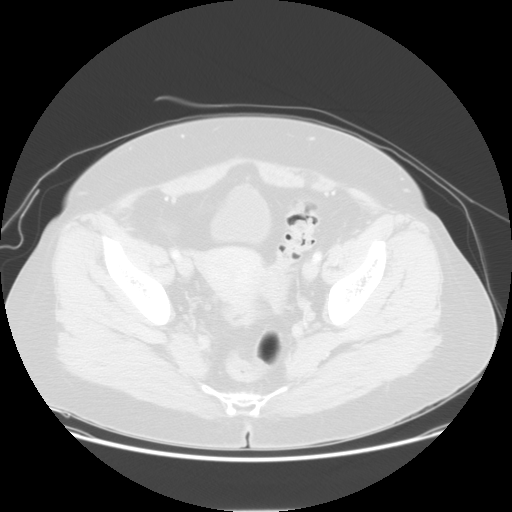
[im 24/95  bone]
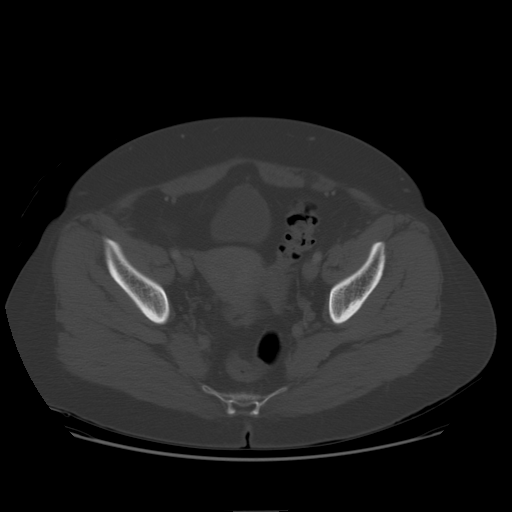
[im 48/95  soft-tissue]
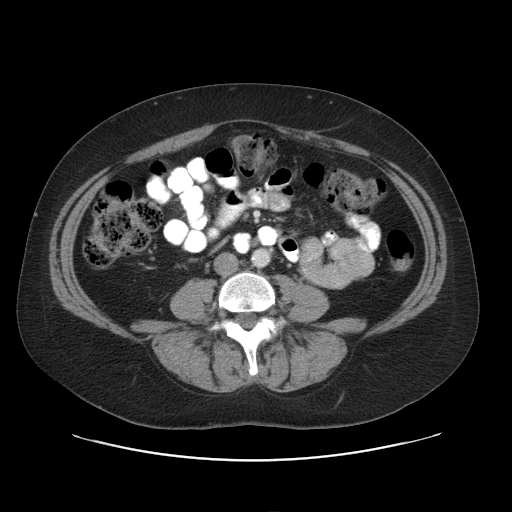
[im 48/95  lung]
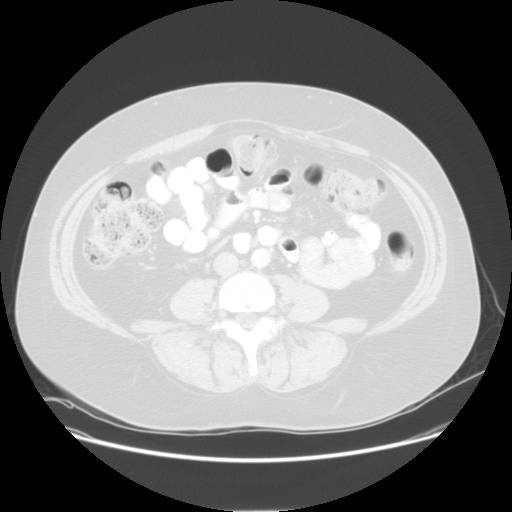
[im 71/95  soft-tissue]
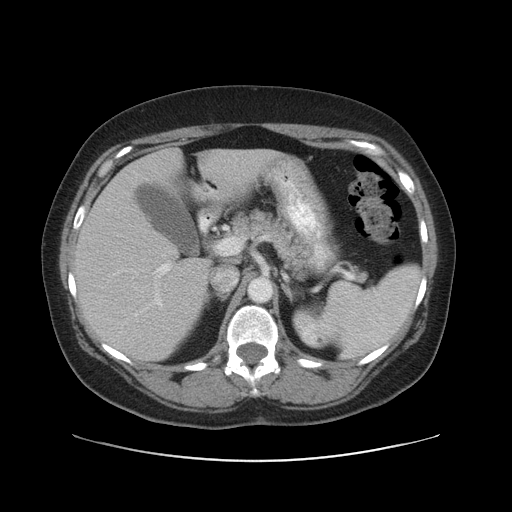
[im 71/95  lung]
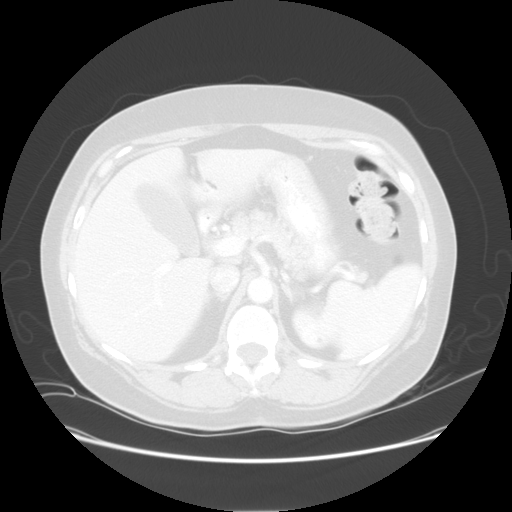

[Series 400: cor · coronal · 0.94mm/px · 4 of 153 slices shown]
[im 17/153  soft-tissue]
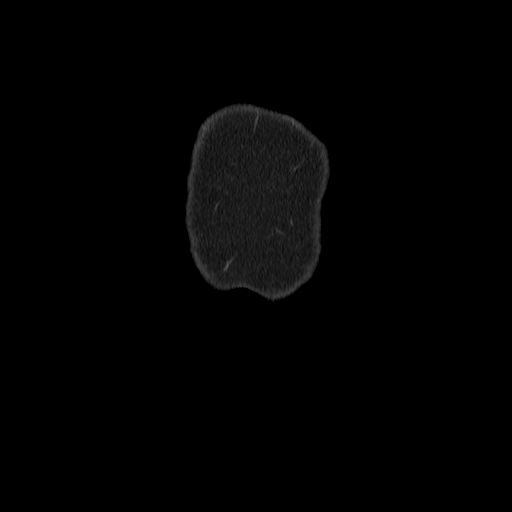
[im 34/153  soft-tissue]
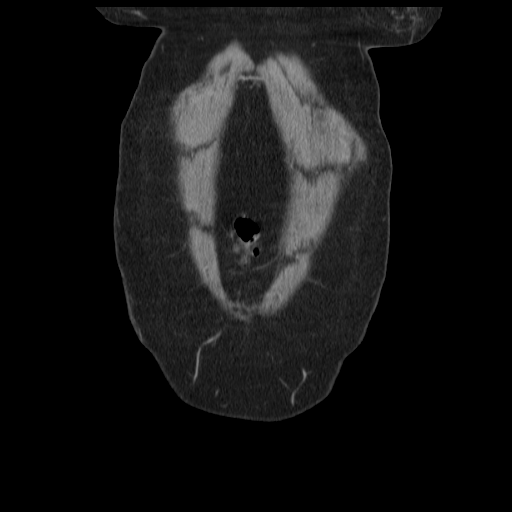
[im 51/153  soft-tissue]
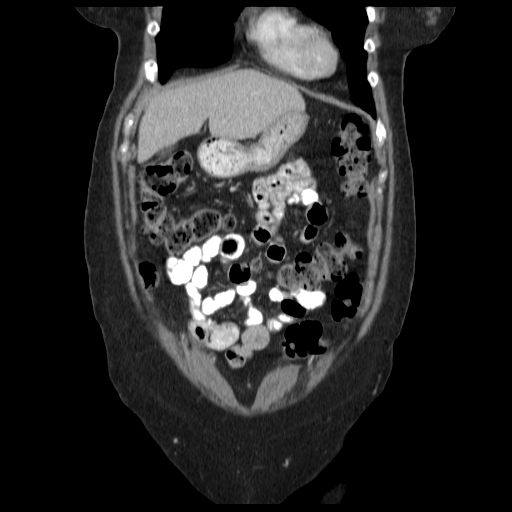
[im 68/153  soft-tissue]
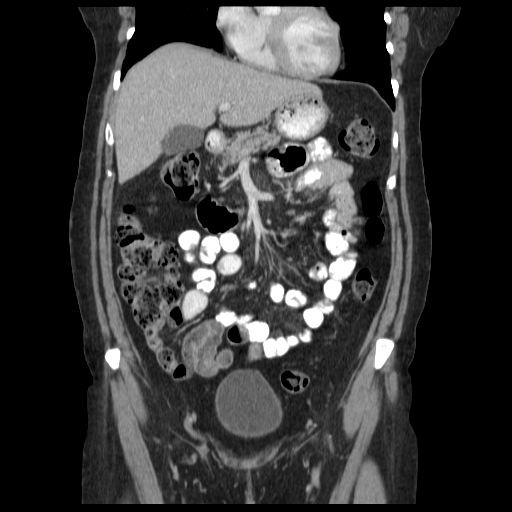

[Series 401: sag · sagittal · 0.94mm/px · 8 of 189 slices shown]
[im 18/189  soft-tissue]
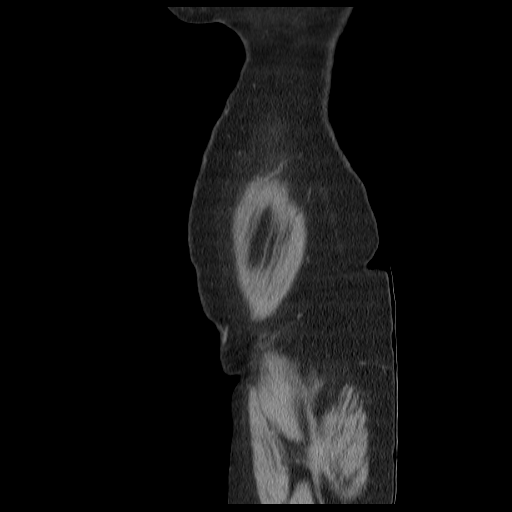
[im 35/189  soft-tissue]
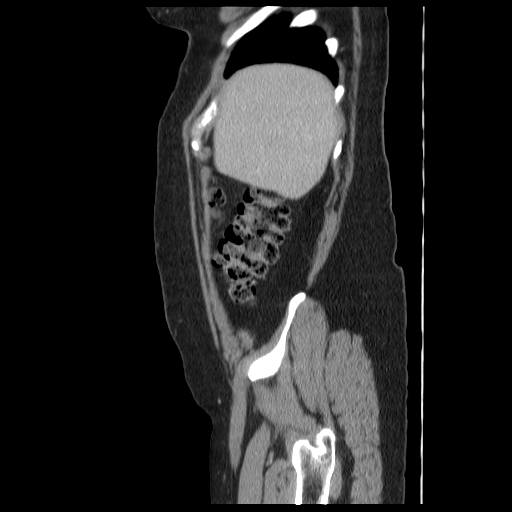
[im 69/189  soft-tissue]
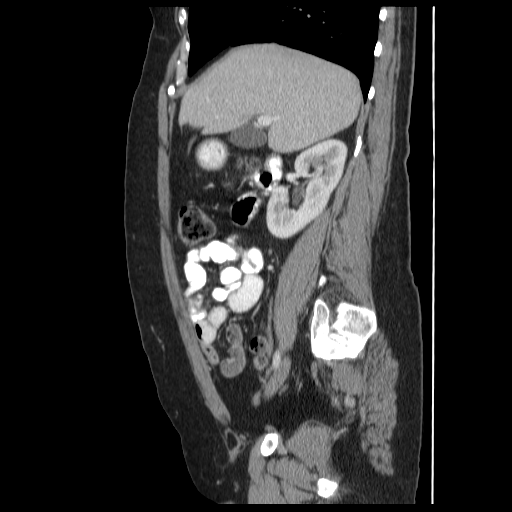
[im 86/189  soft-tissue]
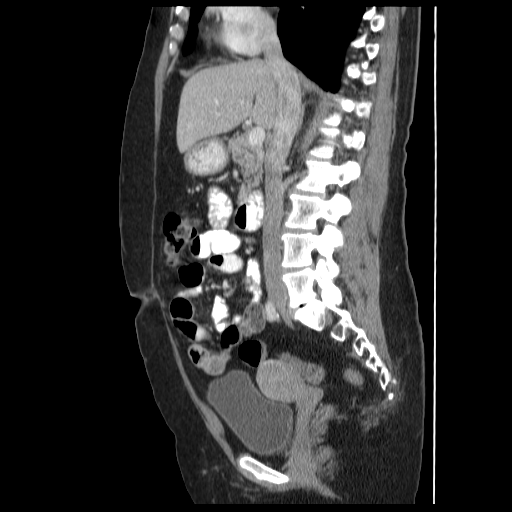
[im 103/189  soft-tissue]
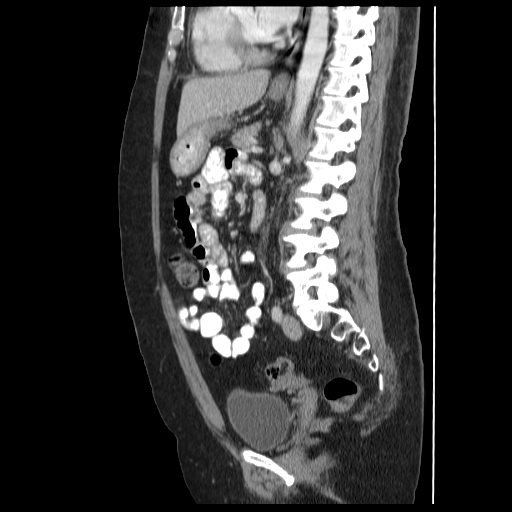
[im 120/189  soft-tissue]
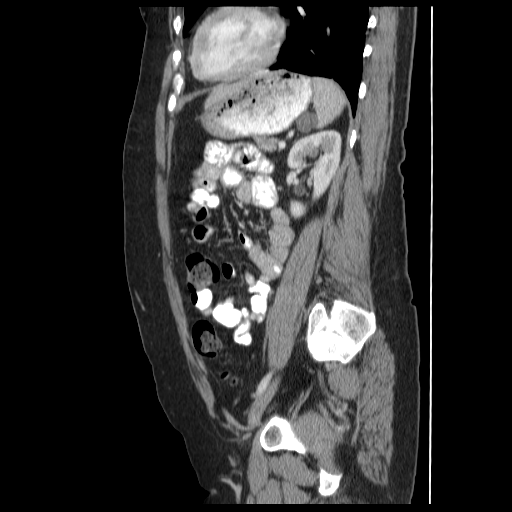
[im 154/189  soft-tissue]
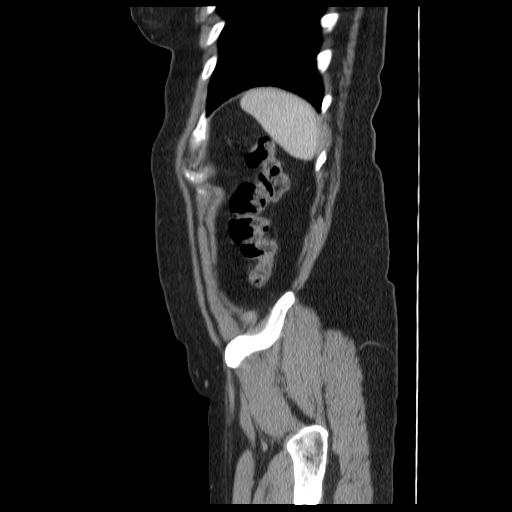
[im 171/189  soft-tissue]
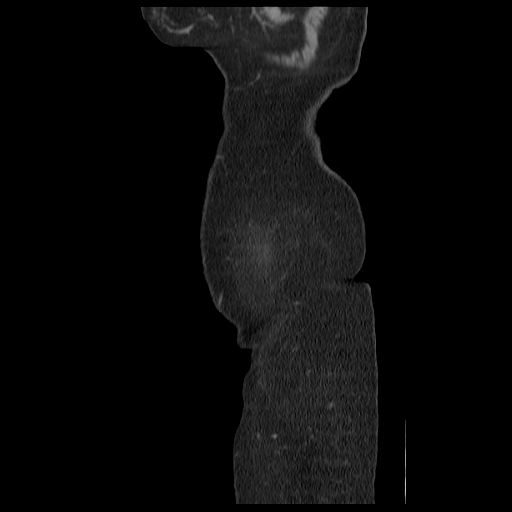

[15 of 32 positions shown; findings below may reference images not displayed]

FINDINGS: The lung bases are clear.  A cystic air space is noted on
the left.  A 4 mm nodule is noted at the left lung base on image
number 11.  No other pulmonary nodules.  No pleural effusion.

The liver is unremarkable.  No focal lesions or biliary dilatation.
The gallbladder is normal.  No common bile duct dilatation.  The
pancreas is normal.  The spleen is normal.

There is a 17 mm solid appearing nodule in the left upper quadrant
located between the lateral limb of the adrenal gland, the spleen
and the posterior aspect of the fundal region of the stomach.  This
could be a gastric diverticulum or an exophytic splenic or adrenal
gland lesion.

The right adrenal gland and both kidneys are normal.  A left renal
cyst is noted.

The stomach, duodenum, small bowel and colon are unremarkable.
There is moderate stool throughout the colon.  Diverticulosis of
the sigmoid colon is noted.  No mesenteric or retroperitoneal mass
or adenopathy.  The aorta is normal in caliber.  The major branch
vessels are patent.

The uterus and ovaries are normal.  The bladder is normal.  No
pelvic mass, adenopathy or free pelvic fluid collections.  No
inguinal mass or hernia.  The bony structures are unremarkable.
IMPRESSION: 1.  4 mm left lower lobe pulmonary nodule.  Recommend noncontrast
chest CT follow-up in 6 months.
2.  17 mm left upper quadrant lesion as discussed above.  This
could also be followed on the follow up CT scan.
3.  Diverticulosis of the sigmoid colon but no findings for
diverticulitis.

## 2014-10-20 DIAGNOSIS — T85848A Pain due to other internal prosthetic devices, implants and grafts, initial encounter: Secondary | ICD-10-CM | POA: Insufficient documentation

## 2014-10-20 DIAGNOSIS — M1990 Unspecified osteoarthritis, unspecified site: Secondary | ICD-10-CM | POA: Insufficient documentation

## 2015-04-28 DIAGNOSIS — M1712 Unilateral primary osteoarthritis, left knee: Secondary | ICD-10-CM | POA: Insufficient documentation

## 2015-06-23 ENCOUNTER — Encounter: Payer: Self-pay | Admitting: Family Medicine

## 2015-06-23 DIAGNOSIS — G4700 Insomnia, unspecified: Secondary | ICD-10-CM | POA: Insufficient documentation

## 2015-06-23 DIAGNOSIS — F411 Generalized anxiety disorder: Secondary | ICD-10-CM | POA: Insufficient documentation

## 2015-06-23 DIAGNOSIS — N951 Menopausal and female climacteric states: Secondary | ICD-10-CM | POA: Insufficient documentation

## 2015-06-23 DIAGNOSIS — G8929 Other chronic pain: Secondary | ICD-10-CM | POA: Insufficient documentation

## 2015-06-23 DIAGNOSIS — S0300XA Dislocation of jaw, unspecified side, initial encounter: Secondary | ICD-10-CM | POA: Insufficient documentation

## 2015-06-23 DIAGNOSIS — F329 Major depressive disorder, single episode, unspecified: Secondary | ICD-10-CM | POA: Insufficient documentation

## 2015-06-23 DIAGNOSIS — R748 Abnormal levels of other serum enzymes: Secondary | ICD-10-CM | POA: Insufficient documentation

## 2015-06-23 DIAGNOSIS — G43909 Migraine, unspecified, not intractable, without status migrainosus: Secondary | ICD-10-CM | POA: Insufficient documentation

## 2015-06-23 DIAGNOSIS — E669 Obesity, unspecified: Secondary | ICD-10-CM | POA: Insufficient documentation

## 2015-06-23 DIAGNOSIS — F1020 Alcohol dependence, uncomplicated: Secondary | ICD-10-CM | POA: Insufficient documentation

## 2015-06-23 DIAGNOSIS — M1990 Unspecified osteoarthritis, unspecified site: Secondary | ICD-10-CM | POA: Insufficient documentation

## 2015-06-23 DIAGNOSIS — F4320 Adjustment disorder, unspecified: Secondary | ICD-10-CM | POA: Insufficient documentation

## 2015-06-23 DIAGNOSIS — K219 Gastro-esophageal reflux disease without esophagitis: Secondary | ICD-10-CM | POA: Insufficient documentation

## 2015-06-23 DIAGNOSIS — Z8669 Personal history of other diseases of the nervous system and sense organs: Secondary | ICD-10-CM | POA: Insufficient documentation

## 2015-06-23 DIAGNOSIS — I10 Essential (primary) hypertension: Secondary | ICD-10-CM | POA: Insufficient documentation

## 2015-06-23 DIAGNOSIS — M519 Unspecified thoracic, thoracolumbar and lumbosacral intervertebral disc disorder: Secondary | ICD-10-CM | POA: Insufficient documentation

## 2015-08-24 ENCOUNTER — Encounter: Payer: Self-pay | Admitting: Family Medicine

## 2015-08-24 ENCOUNTER — Ambulatory Visit (INDEPENDENT_AMBULATORY_CARE_PROVIDER_SITE_OTHER): Payer: BLUE CROSS/BLUE SHIELD | Admitting: Family Medicine

## 2015-08-24 VITALS — BP 100/66 | HR 105 | Temp 98.1°F | Resp 16 | Wt 150.0 lb

## 2015-08-24 DIAGNOSIS — B354 Tinea corporis: Secondary | ICD-10-CM | POA: Diagnosis not present

## 2015-08-24 DIAGNOSIS — F988 Other specified behavioral and emotional disorders with onset usually occurring in childhood and adolescence: Secondary | ICD-10-CM | POA: Insufficient documentation

## 2015-08-24 MED ORDER — TERBINAFINE HCL 250 MG PO TABS: 250.0000 mg | ORAL_TABLET | Freq: Every day | ORAL | Status: DC

## 2015-08-24 NOTE — Patient Instructions (Signed)
May also try a topical like clotrimazole (Lotrimin). Continue 2 weeks beyond clearing of rash. If not improving call for dermatology referral.

## 2015-08-24 NOTE — Progress Notes (Signed)
Subjective:     Patient ID: Alicia Rice, female   DOB: April 04, 1961, 54 y.o.   MRN: [id number]  HPI  Chief Complaint  Patient presents with  . Skin Problem    Patient comes in office today for skin check. Patient states that 3 weeks ago she noticed redness and circular like lesions on her left breast, patient describes as itchy. Patient states that lesions have spread underneath her breast causing irritation and now are covering her inner thigh. Patient states that she is outdoors alot and was unsure if this was a bug bite or possible ring worm.   States she spends lot of time with her [place] playing with him outside. Also dog will stay inside.States they have postponed surgery on her knee due to this rash. States she was treated with 5 days of nasal abx for ? Of MRSA carriage in preparation for surgery. Has not been using any medication for this.   Review of Systems  Skin:       No hx of eczema       Objective:   Physical Exam  Constitutional: She appears well-developed and well-nourished. No distress.  Skin:  Several annular lesions on her chest and left inner thigh varying from 1-2 cm.with raised edges and central clearing. Minimal scaling noted.       Assessment:    1. Tinea corporis - terbinafine (LAMISIL) 250 MG tablet; Take 1 tablet (250 mg total) by mouth daily.  Dispense: 30 tablet; Refill: 0    Plan:   May also try clotrimazole topically. Consider dermatology referral if not improving.

## 2015-09-29 ENCOUNTER — Other Ambulatory Visit: Payer: Self-pay | Admitting: Family Medicine

## 2015-10-21 ENCOUNTER — Telehealth: Payer: Self-pay | Admitting: Emergency Medicine

## 2015-10-21 ENCOUNTER — Encounter: Payer: Self-pay | Admitting: Family Medicine

## 2015-10-21 ENCOUNTER — Ambulatory Visit (INDEPENDENT_AMBULATORY_CARE_PROVIDER_SITE_OTHER): Payer: BLUE CROSS/BLUE SHIELD | Admitting: Family Medicine

## 2015-10-21 VITALS — BP 132/80 | HR 98 | Temp 97.9°F | Resp 16 | Wt 159.0 lb

## 2015-10-21 DIAGNOSIS — M179 Osteoarthritis of knee, unspecified: Secondary | ICD-10-CM

## 2015-10-21 DIAGNOSIS — I10 Essential (primary) hypertension: Secondary | ICD-10-CM | POA: Diagnosis not present

## 2015-10-21 DIAGNOSIS — B354 Tinea corporis: Secondary | ICD-10-CM

## 2015-10-21 DIAGNOSIS — M1712 Unilateral primary osteoarthritis, left knee: Secondary | ICD-10-CM

## 2015-10-21 MED ORDER — CLOTRIMAZOLE-BETAMETHASONE 1-0.05 % EX CREA: 1.0000 "application " | TOPICAL_CREAM | Freq: Two times a day (BID) | CUTANEOUS | Status: DC

## 2015-10-21 NOTE — Telephone Encounter (Signed)
lmtcb-aa 

## 2015-10-21 NOTE — Telephone Encounter (Signed)
Pt wanted to know what she can take for hand and feet cramps, she forgot to ask while she was here. She reports that she was prescribed Potassium before but did not know if you wanted to refill this or tell her to take something else. Please advise.

## 2015-10-21 NOTE — Telephone Encounter (Signed)
Magnesium oxide twice a day 400 mg

## 2015-10-21 NOTE — Progress Notes (Signed)
Patient ID: Alicia Rice, female   DOB: 03-20-1961, 55 y.o.   MRN: [id number]    Subjective:  HPI Pt is here for a rash. It has been there since November, early December. She saw Mikki Santee for this and he told her it was ringworm. Treated her with Lamisil and it has not resolved. She is suppose to have surgery of her knee but can not get it done until she has this cleared. It is located on her breast and her groin area. She reports that it looks better today but yesterday it was "rough looking"   Hypertension, follow-up:  BP Readings from Last 3 Encounters:  10/21/15 132/80  08/24/15 100/66  12/23/14 102/58    She was last seen for hypertension 6 months ago.  BP at that visit was 100/66. Management since that visit includes none. She reports good compliance with treatment. She is not having side effects.  She is not exercising. She is adherent to low salt diet.   Outside blood pressures are being checked but is inconsistent. She is experiencing chest pain. She has mentioned this before. She had a surgical work up in November for surgery and she had been having this pain. Patient denies chest pressure/discomfort, claudication, fatigue, lower extremity edema and palpitations.     Wt Readings from Last 3 Encounters:  10/21/15 159 lb (72.122 kg)  08/24/15 150 lb (68.04 kg)  12/23/14 166 lb (75.297 kg)    ------------------------------------------------------------------------     Prior to Admission medications   Medication Sig Start Date End Date Taking? Authorizing Provider  amphetamine-dextroamphetamine (ADDERALL) 20 MG tablet  08/07/15  Yes Historical Provider, MD  baclofen (LIORESAL) 10 MG tablet Take by mouth.   Yes Historical Provider, MD  cyclobenzaprine (FLEXERIL) 10 MG tablet Take 10 mg by mouth 2 (two) times daily. 08/01/15  Yes Historical Provider, MD  DULoxetine (CYMBALTA) 60 MG capsule Take by mouth.   Yes Historical Provider, MD  gabapentin (NEURONTIN) 600 MG tablet   08/19/15  Yes Historical Provider, MD  losartan-hydrochlorothiazide (HYZAAR) 100-25 MG tablet Take by mouth. 10/22/14  Yes Historical Provider, MD  Oxycodone HCl 10 MG TABS TAKE 1 TAB BY MOUTH 4 TIMES A DAY AS NEEDED FOR PAIN 08/21/15  Yes Historical Provider, MD  ranitidine (ZANTAC) 150 MG capsule Take 150 mg by mouth daily.   Yes Historical Provider, MD  terbinafine (LAMISIL) 250 MG tablet TAKE 1 TABLET BY MOUTH EVERY DAY Patient not taking: Reported on 10/21/2015 09/29/15   Carmon Ginsberg, PA    Patient Active Problem List   Diagnosis Date Noted  . ADD (attention deficit disorder) 08/24/2015  . Adaptation reaction 06/23/2015  . Alcoholism ([place]) 06/23/2015  . Arthritis sicca 06/23/2015  . Chronic pain 06/23/2015  . Abnormal liver enzymes 06/23/2015  . Anxiety, generalized 06/23/2015  . Acid reflux 06/23/2015  . Thoracic, thoracolumbar and lumbosacral intervertebral disc disorder 06/23/2015  . History of brain disorder 06/23/2015  . BP (high blood pressure) 06/23/2015  . Cannot sleep 06/23/2015  . Major depressive disorder ([place]) 06/23/2015  . Menopausal symptom 06/23/2015  . Headache, migraine 06/23/2015  . Adiposity 06/23/2015  . Dislocation of temporomandibular joint 06/23/2015  . Arthritis of knee, degenerative 04/28/2015  . Arthritis 10/20/2014  . Pain due to any device, implant or graft 10/20/2014    Past Medical History  Diagnosis Date  . Hypertension     Social History   Social History  . Marital Status: Married    Spouse Name: N/A  . Number of  Children: N/A  . Years of Education: N/A   Occupational History  . Not on file.   Social History Main Topics  . Smoking status: Current Every Day Smoker  . Smokeless tobacco: Not on file  . Alcohol Use: No  . Drug Use: No  . Sexual Activity: Not on file   Other Topics Concern  . Not on file   Social History Narrative    No Known Allergies  Review of Systems  Constitutional: Negative.   HENT: Negative.   Eyes:  Negative.   Respiratory: Negative.   Cardiovascular: Positive for chest pain.  Gastrointestinal: Negative.   Genitourinary: Negative.   Musculoskeletal: Positive for joint pain.  Skin: Negative.   Neurological: Negative.   Endo/Heme/Allergies: Negative.   Psychiatric/Behavioral: Negative.     Immunization History  Administered Date(s) Administered  . Tdap 04/17/2012   Objective:  BP 132/80 mmHg  Pulse 98  Temp(Src) 97.9 F (36.6 C) (Oral)  Resp 16  Wt 159 lb (72.122 kg)  Physical Exam  Constitutional: She is oriented to person, place, and time and well-developed, well-nourished, and in no distress.  HENT:  Head: Normocephalic and atraumatic.  Eyes: Conjunctivae are normal.  Neck: Neck supple.  Cardiovascular: Normal rate, regular rhythm and normal heart sounds.   Pulmonary/Chest: Effort normal and breath sounds normal.  Abdominal: Soft.  Neurological: She is alert and oriented to person, place, and time.  Skin: Rash noted.  Mild rash in left groin region and under left breast most c/w tinea corporis.  Psychiatric: Mood, memory, affect and judgment normal.    Lab Results  Component Value Date   WBC 8.0 02/22/2012   HGB 14.1 02/22/2012   HCT 40.6 02/22/2012   PLT 294 02/22/2012   GLUCOSE 117* 02/22/2012   TSH 1.11 04/17/2012    CMP     Component Value Date/Time   NA 138 04/17/2012   NA 141 02/22/2012 2322   K 4.0 04/17/2012   CL 102 02/22/2012 2322   CO2 26 02/22/2012 2322   GLUCOSE 117* 02/22/2012 2322   BUN 13 04/17/2012   BUN 28* 02/22/2012 2322   CREATININE 0.6 04/17/2012   CREATININE 0.83 02/22/2012 2322   CALCIUM 9.3 02/22/2012 2322   AST 10* 04/17/2012   ALT 11 04/17/2012   ALKPHOS 64 04/17/2012   GFRNONAA 81* 02/22/2012 2322   GFRAA >90 02/22/2012 2322    Assessment and Plan :  1. Tinea corporis Dermatology referral if this does not resolve. - clotrimazole-betamethasone (LOTRISONE) cream; Apply 1 application topically 2 (two) times daily.   Dispense: 30 g; Refill: 1 - Ambulatory referral to Dermatology  2. Osteoarthritis of left knee, unspecified osteoarthritis type Pt scheduled for knee replacement  3. Essential hypertension Good control.  Patient was seen and examined by Dr. Miguel Aschoff, and noted scribed by Webb Laws, [place] MD [facility] 10/21/2015 11:48 AM

## 2015-10-28 NOTE — Telephone Encounter (Signed)
Pt advised-aa 

## 2015-12-23 ENCOUNTER — Telehealth: Payer: Self-pay

## 2015-12-23 NOTE — Telephone Encounter (Signed)
Lmtcb, need to know when her last mammogram and pap smear was=aa

## 2016-05-05 ENCOUNTER — Ambulatory Visit: Payer: BLUE CROSS/BLUE SHIELD | Admitting: Family Medicine

## 2016-05-11 ENCOUNTER — Ambulatory Visit (INDEPENDENT_AMBULATORY_CARE_PROVIDER_SITE_OTHER): Payer: BLUE CROSS/BLUE SHIELD | Admitting: Family Medicine

## 2016-05-11 ENCOUNTER — Encounter: Payer: Self-pay | Admitting: Family Medicine

## 2016-05-11 VITALS — BP 152/84 | HR 86 | Temp 98.0°F | Resp 18

## 2016-05-11 DIAGNOSIS — I10 Essential (primary) hypertension: Secondary | ICD-10-CM

## 2016-05-11 DIAGNOSIS — F909 Attention-deficit hyperactivity disorder, unspecified type: Secondary | ICD-10-CM

## 2016-05-11 DIAGNOSIS — F988 Other specified behavioral and emotional disorders with onset usually occurring in childhood and adolescence: Secondary | ICD-10-CM

## 2016-05-11 DIAGNOSIS — N951 Menopausal and female climacteric states: Secondary | ICD-10-CM

## 2016-05-11 DIAGNOSIS — B351 Tinea unguium: Secondary | ICD-10-CM

## 2016-05-11 DIAGNOSIS — M1712 Unilateral primary osteoarthritis, left knee: Secondary | ICD-10-CM

## 2016-05-11 DIAGNOSIS — M179 Osteoarthritis of knee, unspecified: Secondary | ICD-10-CM | POA: Diagnosis not present

## 2016-05-11 DIAGNOSIS — F411 Generalized anxiety disorder: Secondary | ICD-10-CM

## 2016-05-11 MED ORDER — LOSARTAN POTASSIUM-HCTZ 100-25 MG PO TABS: 1.0000 | ORAL_TABLET | Freq: Every day | ORAL | 0 refills | Status: DC

## 2016-05-11 MED ORDER — CICLOPIROX 8 % EX SOLN: Freq: Every day | CUTANEOUS | 0 refills | Status: DC

## 2016-05-11 MED ORDER — LOSARTAN POTASSIUM-HCTZ 100-25 MG PO TABS: 1.0000 | ORAL_TABLET | Freq: Every day | ORAL | 3 refills | Status: DC

## 2016-05-11 NOTE — Progress Notes (Signed)
Subjective:  HPI  Hypertension, follow-up:  BP Readings from Last 3 Encounters:  05/11/16 (!) 152/84  10/21/15 132/80  08/24/15 100/66    She was last seen for hypertension 6 months ago.  BP at that visit was 132/80. Management since that visit includes none. She reports good compliance with treatment. She is not having side effects.  She is not exercising. She is adherent to low salt diet.   Outside blood pressures are being checked at home and have been running about like they are today and a little higher. She is experiencing none.  Patient denies chest pain, chest pressure/discomfort, claudication, dyspnea, exertional chest pressure/discomfort, fatigue, irregular heart beat, lower extremity edema, near-syncope, orthopnea, palpitations, paroxysmal nocturnal dyspnea and syncope.   Cardiovascular risk factors include hypertension and smoking/ tobacco exposure.  Wt Readings from Last 3 Encounters:  10/21/15 159 lb (72.1 kg)  08/24/15 150 lb (68 kg)  12/23/14 166 lb (75.3 kg)    ------------------------------------------------------------------------ Pt is still complaining of cramps through out her body.  She complains of ongoing toenail fungus. This was seen by dermatology. She has chronic arthritic pain. She has chronic depression followed by Dr. Toy Care.  Prior to Admission medications   Medication Sig Start Date End Date Taking? Authorizing Provider  amphetamine-dextroamphetamine (ADDERALL) 20 MG tablet  08/07/15   Historical Provider, MD  baclofen (LIORESAL) 10 MG tablet Take by mouth.    Historical Provider, MD  clotrimazole-betamethasone (LOTRISONE) cream Apply 1 application topically 2 (two) times daily. 10/21/15   Ayjah Show Maceo Pro., MD  cyclobenzaprine (FLEXERIL) 10 MG tablet Take 10 mg by mouth 2 (two) times daily. 08/01/15   Historical Provider, MD  DULoxetine (CYMBALTA) 60 MG capsule Take by mouth.    Historical Provider, MD  gabapentin (NEURONTIN) 600 MG  tablet  08/19/15   Historical Provider, MD  losartan-hydrochlorothiazide (HYZAAR) 100-25 MG tablet Take by mouth. 10/22/14   Historical Provider, MD  Oxycodone HCl 10 MG TABS TAKE 1 TAB BY MOUTH 4 TIMES A DAY AS NEEDED FOR PAIN 08/21/15   Historical Provider, MD  ranitidine (ZANTAC) 150 MG capsule Take 150 mg by mouth daily.    Historical Provider, MD  terbinafine (LAMISIL) 250 MG tablet TAKE 1 TABLET BY MOUTH EVERY DAY Patient not taking: Reported on 10/21/2015 09/29/15   Carmon Ginsberg, PA    Patient Active Problem List   Diagnosis Date Noted  . ADD (attention deficit disorder) 08/24/2015  . Adaptation reaction 06/23/2015  . Alcoholism ([place]) 06/23/2015  . Arthritis sicca 06/23/2015  . Chronic pain 06/23/2015  . Abnormal liver enzymes 06/23/2015  . Anxiety, generalized 06/23/2015  . Acid reflux 06/23/2015  . Thoracic, thoracolumbar and lumbosacral intervertebral disc disorder 06/23/2015  . History of brain disorder 06/23/2015  . BP (high blood pressure) 06/23/2015  . Cannot sleep 06/23/2015  . Major depressive disorder ([place]) 06/23/2015  . Menopausal symptom 06/23/2015  . Headache, migraine 06/23/2015  . Adiposity 06/23/2015  . Dislocation of temporomandibular joint 06/23/2015  . Arthritis of knee, degenerative 04/28/2015  . Arthritis 10/20/2014  . Pain due to any device, implant or graft 10/20/2014    Past Medical History:  Diagnosis Date  . Hypertension     Social History   Social History  . Marital status: Married    Spouse name: N/A  . Number of children: N/A  . Years of education: N/A   Occupational History  . Not on file.   Social History Main Topics  . Smoking status: Current Every  Day Smoker  . Smokeless tobacco: Never Used  . Alcohol use No  . Drug use: No  . Sexual activity: Not on file   Other Topics Concern  . Not on file   Social History Narrative  . No narrative on file    No Known Allergies  Review of Systems  Constitutional: Negative.     HENT: Negative.   Eyes: Negative.   Respiratory: Negative.   Cardiovascular: Negative.   Gastrointestinal: Negative.   Genitourinary: Negative.   Musculoskeletal: Positive for joint pain and myalgias.  Skin: Negative.   Neurological: Negative.   Endo/Heme/Allergies: Negative.     Immunization History  Administered Date(s) Administered  . Tdap 04/17/2012   Objective:  BP (!) 152/84 (BP Location: Left Arm, Patient Position: Sitting, Cuff Size: Normal)   Pulse 86   Temp 98 F (36.7 C) (Oral)   Resp 18   Physical Exam  Constitutional: She is oriented to person, place, and time and well-developed, well-nourished, and in no distress.  HENT:  Head: Normocephalic and atraumatic.  Eyes: Conjunctivae and EOM are normal. Pupils are equal, round, and reactive to light.  Neck: Normal range of motion. Neck supple.  Cardiovascular: Normal rate, regular rhythm, normal heart sounds and intact distal pulses.   Pulmonary/Chest: Effort normal and breath sounds normal.  Abdominal: Soft.  Musculoskeletal:  Patient moves very slowly. She has a brace on her left knee.  Neurological: She is alert and oriented to person, place, and time. She has normal reflexes. Gait normal. GCS score is 15.  Skin: Skin is warm and dry.  Psychiatric: Mood, memory, affect and judgment normal.    Lab Results  Component Value Date   WBC 8.0 02/22/2012   HGB 14.1 02/22/2012   HCT 40.6 02/22/2012   PLT 294 02/22/2012   GLUCOSE 117 (H) 02/22/2012   TSH 1.11 04/17/2012    CMP     Component Value Date/Time   NA 138 04/17/2012   K 4.0 04/17/2012   CL 102 02/22/2012 2322   CO2 26 02/22/2012 2322   GLUCOSE 117 (H) 02/22/2012 2322   BUN 13 04/17/2012   CREATININE 0.6 04/17/2012   CREATININE 0.83 02/22/2012 2322   CALCIUM 9.3 02/22/2012 2322   AST 10 (A) 04/17/2012   ALT 11 04/17/2012   ALKPHOS 64 04/17/2012   GFRNONAA 81 (L) 02/22/2012 2322   GFRAA >90 02/22/2012 2322    Assessment and Plan :  1.  Essential hypertension  - TSH - losartan-hydrochlorothiazide (HYZAAR) 100-25 MG tablet; Take 1 tablet by mouth daily.  Dispense: 30 tablet; Refill: 0  2. Osteoarthritis of left knee, unspecified osteoarthritis type   3. ADD (attention deficit disorder)   4. Anxiety, generalized   5. Nail fungus Told patient this will take 6 months to resolve. Refer back to dermatology for culture of nails if topical fails - ciclopirox (PENLAC) 8 % solution; Apply topically at bedtime.  Dispense: 6.6 mL; Refill: 0 - Comprehensive metabolic panel  6. Menopausal symptom Sweats likely for menopause. It is possible that there also a side effect of SNRI which patient needs for her depression - [facility] - [facility]   Patient was seen and examined by Dr. Miguel Aschoff, and noted scribed by Webb Laws, CMA I have done the exam and reviewed the above chart and it is accurate to the best of my knowledge.  Miguel Aschoff MD [facility] 05/11/2016 4:38 PM

## 2016-05-12 ENCOUNTER — Telehealth: Payer: Self-pay | Admitting: Emergency Medicine

## 2016-05-12 NOTE — Telephone Encounter (Signed)
Pt forgot to ask you what she should do about her muscle cramps. She has been taking Magnesium and it is not working. Says she took Klor Con in the past and wanted to know if she needed to take it again. We ordered a Met C yesterday. 

## 2016-05-12 NOTE — Telephone Encounter (Signed)
Waiting on labs to come back. Sometimes on nightly bottle of tonic water will help the muscle cramps

## 2016-05-13 NOTE — Telephone Encounter (Signed)
lmtcb-aa 

## 2016-05-16 NOTE — Telephone Encounter (Signed)
Patient advised on voicemail-aa

## 2016-07-09 ENCOUNTER — Other Ambulatory Visit: Payer: Self-pay | Admitting: Family Medicine

## 2016-07-09 DIAGNOSIS — I10 Essential (primary) hypertension: Secondary | ICD-10-CM

## 2016-11-14 ENCOUNTER — Ambulatory Visit: Payer: BLUE CROSS/BLUE SHIELD | Admitting: Family Medicine

## 2017-02-17 DIAGNOSIS — M19071 Primary osteoarthritis, right ankle and foot: Secondary | ICD-10-CM | POA: Insufficient documentation

## 2017-07-03 ENCOUNTER — Other Ambulatory Visit: Payer: Self-pay | Admitting: Orthopedic Surgery

## 2017-07-04 ENCOUNTER — Other Ambulatory Visit: Payer: Self-pay | Admitting: Family Medicine

## 2017-07-04 ENCOUNTER — Encounter (HOSPITAL_BASED_OUTPATIENT_CLINIC_OR_DEPARTMENT_OTHER): Payer: Self-pay | Admitting: *Deleted

## 2017-07-04 DIAGNOSIS — I10 Essential (primary) hypertension: Secondary | ICD-10-CM

## 2017-07-06 ENCOUNTER — Encounter (HOSPITAL_BASED_OUTPATIENT_CLINIC_OR_DEPARTMENT_OTHER)
Admission: RE | Admit: 2017-07-06 | Discharge: 2017-07-06 | Disposition: A | Discharge status: Home / Self Care | Payer: BLUE CROSS/BLUE SHIELD | Source: Ambulatory Visit | Attending: Orthopedic Surgery | Admitting: Orthopedic Surgery

## 2017-07-06 DIAGNOSIS — I10 Essential (primary) hypertension: Secondary | ICD-10-CM | POA: Diagnosis not present

## 2017-07-06 DIAGNOSIS — F17210 Nicotine dependence, cigarettes, uncomplicated: Secondary | ICD-10-CM | POA: Diagnosis not present

## 2017-07-06 DIAGNOSIS — F329 Major depressive disorder, single episode, unspecified: Secondary | ICD-10-CM | POA: Diagnosis not present

## 2017-07-06 DIAGNOSIS — X58XXXA Exposure to other specified factors, initial encounter: Secondary | ICD-10-CM | POA: Diagnosis not present

## 2017-07-06 DIAGNOSIS — G5602 Carpal tunnel syndrome, left upper limb: Secondary | ICD-10-CM | POA: Diagnosis not present

## 2017-07-06 DIAGNOSIS — K219 Gastro-esophageal reflux disease without esophagitis: Secondary | ICD-10-CM | POA: Diagnosis not present

## 2017-07-06 DIAGNOSIS — S52592A Other fractures of lower end of left radius, initial encounter for closed fracture: Secondary | ICD-10-CM | POA: Diagnosis present

## 2017-07-06 DIAGNOSIS — M1712 Unilateral primary osteoarthritis, left knee: Secondary | ICD-10-CM | POA: Diagnosis not present

## 2017-07-06 DIAGNOSIS — Z79899 Other long term (current) drug therapy: Secondary | ICD-10-CM | POA: Diagnosis not present

## 2017-07-06 LAB — BASIC METABOLIC PANEL
Anion gap: 10 (ref 5–15)
BUN: 21 mg/dL — ABNORMAL HIGH (ref 6–20)
CO2: 29 mmol/L (ref 22–32)
Calcium: 9.1 mg/dL (ref 8.9–10.3)
Chloride: 92 mmol/L — ABNORMAL LOW (ref 101–111)
Creatinine, Ser: 0.74 mg/dL (ref 0.44–1.00)
GFR calc Af Amer: >60 mL/min (ref >60)
GFR calc non Af Amer: >60 mL/min (ref >60)
Glucose, Bld: 86 mg/dL (ref 65–99)
Potassium: 4.1 mmol/L (ref 3.5–5.1)
Sodium: 131 mmol/L — ABNORMAL LOW (ref 135–145)

## 2017-07-07 ENCOUNTER — Encounter (HOSPITAL_BASED_OUTPATIENT_CLINIC_OR_DEPARTMENT_OTHER): Payer: Self-pay | Admitting: Anesthesiology

## 2017-07-07 ENCOUNTER — Encounter (HOSPITAL_BASED_OUTPATIENT_CLINIC_OR_DEPARTMENT_OTHER)
Admission: RE | Disposition: A | Discharge status: Home / Self Care | Payer: Self-pay | Source: Ambulatory Visit | Attending: Orthopedic Surgery

## 2017-07-07 ENCOUNTER — Ambulatory Visit (HOSPITAL_BASED_OUTPATIENT_CLINIC_OR_DEPARTMENT_OTHER): Payer: BLUE CROSS/BLUE SHIELD | Admitting: Anesthesiology

## 2017-07-07 ENCOUNTER — Ambulatory Visit (HOSPITAL_BASED_OUTPATIENT_CLINIC_OR_DEPARTMENT_OTHER)
Admission: RE | Admit: 2017-07-07 | Discharge: 2017-07-07 | Disposition: A | Discharge status: Home / Self Care | Payer: BLUE CROSS/BLUE SHIELD | Source: Ambulatory Visit | Attending: Orthopedic Surgery | Admitting: Orthopedic Surgery

## 2017-07-07 DIAGNOSIS — F17210 Nicotine dependence, cigarettes, uncomplicated: Secondary | ICD-10-CM | POA: Insufficient documentation

## 2017-07-07 DIAGNOSIS — S52502A Unspecified fracture of the lower end of left radius, initial encounter for closed fracture: Secondary | ICD-10-CM

## 2017-07-07 DIAGNOSIS — X58XXXA Exposure to other specified factors, initial encounter: Secondary | ICD-10-CM | POA: Insufficient documentation

## 2017-07-07 DIAGNOSIS — F329 Major depressive disorder, single episode, unspecified: Secondary | ICD-10-CM | POA: Insufficient documentation

## 2017-07-07 DIAGNOSIS — G5602 Carpal tunnel syndrome, left upper limb: Secondary | ICD-10-CM

## 2017-07-07 DIAGNOSIS — K219 Gastro-esophageal reflux disease without esophagitis: Secondary | ICD-10-CM | POA: Insufficient documentation

## 2017-07-07 DIAGNOSIS — S52592A Other fractures of lower end of left radius, initial encounter for closed fracture: Secondary | ICD-10-CM | POA: Diagnosis not present

## 2017-07-07 DIAGNOSIS — Z79899 Other long term (current) drug therapy: Secondary | ICD-10-CM | POA: Insufficient documentation

## 2017-07-07 DIAGNOSIS — I10 Essential (primary) hypertension: Secondary | ICD-10-CM | POA: Insufficient documentation

## 2017-07-07 DIAGNOSIS — M1712 Unilateral primary osteoarthritis, left knee: Secondary | ICD-10-CM | POA: Insufficient documentation

## 2017-07-07 HISTORY — DX: Carpal tunnel syndrome, left upper limb: G56.02

## 2017-07-07 HISTORY — DX: Depression, unspecified: F32.A

## 2017-07-07 HISTORY — DX: Nausea with vomiting, unspecified: R11.2

## 2017-07-07 HISTORY — DX: Other specified disorders of teeth and supporting structures: K08.89

## 2017-07-07 HISTORY — DX: Unspecified fracture of the lower end of left radius, initial encounter for closed fracture: S52.502A

## 2017-07-07 HISTORY — DX: Dorsalgia, unspecified: M54.9

## 2017-07-07 HISTORY — DX: Unspecified osteoarthritis, unspecified site: M19.90

## 2017-07-07 HISTORY — DX: Major depressive disorder, single episode, unspecified: F32.9

## 2017-07-07 HISTORY — PX: CARPAL TUNNEL RELEASE: SHX101

## 2017-07-07 HISTORY — DX: Other chronic pain: G89.29

## 2017-07-07 HISTORY — DX: Gastro-esophageal reflux disease without esophagitis: K21.9

## 2017-07-07 HISTORY — PX: OPEN REDUCTION INTERNAL FIXATION (ORIF) DISTAL RADIAL FRACTURE: SHX5989

## 2017-07-07 HISTORY — DX: Other specified postprocedural states: Z98.890

## 2017-07-07 SURGERY — OPEN REDUCTION INTERNAL FIXATION (ORIF) DISTAL RADIUS FRACTURE
Anesthesia: General | Site: Wrist | Laterality: Left

## 2017-07-07 MED ORDER — OXYCODONE HCL 10 MG PO TABS: 10.0000 mg | ORAL_TABLET | Freq: Four times a day (QID) | ORAL | 0 refills | Status: DC | PRN

## 2017-07-07 MED ORDER — DEXAMETHASONE SODIUM PHOSPHATE 10 MG/ML IJ SOLN
INTRAMUSCULAR | Status: AC
  Filled 2017-07-07: qty 1

## 2017-07-07 MED ORDER — ONDANSETRON HCL 4 MG/2ML IJ SOLN
INTRAMUSCULAR | Status: DC | PRN
  Administered 2017-07-07: 4 mg via INTRAVENOUS

## 2017-07-07 MED ORDER — LACTATED RINGERS IV SOLN
INTRAVENOUS | Status: DC
  Administered 2017-07-07: 09:00:00 via INTRAVENOUS

## 2017-07-07 MED ORDER — MIDAZOLAM HCL 2 MG/2ML IJ SOLN
INTRAMUSCULAR | Status: AC
  Filled 2017-07-07: qty 2

## 2017-07-07 MED ORDER — SENNA-DOCUSATE SODIUM 8.6-50 MG PO TABS: 2.0000 | ORAL_TABLET | Freq: Every day | ORAL | 1 refills | Status: DC

## 2017-07-07 MED ORDER — HYDROMORPHONE HCL 2 MG PO TABS: 2.0000 mg | ORAL_TABLET | ORAL | 0 refills | Status: DC | PRN

## 2017-07-07 MED ORDER — ONDANSETRON HCL 4 MG/2ML IJ SOLN
INTRAMUSCULAR | Status: AC
  Filled 2017-07-07: qty 2

## 2017-07-07 MED ORDER — SCOPOLAMINE 1 MG/3DAYS TD PT72: 1.0000 | MEDICATED_PATCH | Freq: Once | TRANSDERMAL | Status: DC | PRN

## 2017-07-07 MED ORDER — PROMETHAZINE HCL 25 MG/ML IJ SOLN: 6.2500 mg | INTRAMUSCULAR | Status: DC | PRN

## 2017-07-07 MED ORDER — FENTANYL CITRATE (PF) 100 MCG/2ML IJ SOLN
50.0000 ug | INTRAMUSCULAR | Status: DC | PRN
  Administered 2017-07-07: 100 ug via INTRAVENOUS

## 2017-07-07 MED ORDER — CEFAZOLIN SODIUM-DEXTROSE 2-4 GM/100ML-% IV SOLN
2.0000 g | INTRAVENOUS | Status: AC
  Administered 2017-07-07: 2 g via INTRAVENOUS

## 2017-07-07 MED ORDER — MIDAZOLAM HCL 2 MG/2ML IJ SOLN
1.0000 mg | INTRAMUSCULAR | Status: DC | PRN
  Administered 2017-07-07: 2 mg via INTRAVENOUS

## 2017-07-07 MED ORDER — HYDROMORPHONE HCL 1 MG/ML IJ SOLN: 0.2500 mg | INTRAMUSCULAR | Status: DC | PRN

## 2017-07-07 MED ORDER — CEFAZOLIN SODIUM-DEXTROSE 2-4 GM/100ML-% IV SOLN
INTRAVENOUS | Status: AC
  Filled 2017-07-07: qty 100

## 2017-07-07 MED ORDER — FENTANYL CITRATE (PF) 100 MCG/2ML IJ SOLN
INTRAMUSCULAR | Status: AC
  Filled 2017-07-07: qty 2

## 2017-07-07 MED ORDER — DEXAMETHASONE SODIUM PHOSPHATE 4 MG/ML IJ SOLN
INTRAMUSCULAR | Status: DC | PRN
  Administered 2017-07-07: 10 mg via INTRAVENOUS

## 2017-07-07 MED ORDER — BUPIVACAINE-EPINEPHRINE (PF) 0.5% -1:200000 IJ SOLN
INTRAMUSCULAR | Status: DC | PRN
  Administered 2017-07-07: 30 mL via PERINEURAL

## 2017-07-07 MED ORDER — LIDOCAINE 2% (20 MG/ML) 5 ML SYRINGE
INTRAMUSCULAR | Status: AC
  Filled 2017-07-07: qty 5

## 2017-07-07 MED ORDER — PROPOFOL 10 MG/ML IV BOLUS
INTRAVENOUS | Status: AC
  Filled 2017-07-07: qty 20

## 2017-07-07 SURGICAL SUPPLY — 72 items
BANDAGE ACE 3X5.8 VEL STRL LF (GAUZE/BANDAGES/DRESSINGS) ×2 IMPLANT
BANDAGE ACE 4X5 VEL STRL LF (GAUZE/BANDAGES/DRESSINGS) ×1 IMPLANT
BIT DRILL 2 FAST STEP (BIT) ×1 IMPLANT
BIT DRILL 2.5X4 QC (BIT) ×1 IMPLANT
BLADE MINI RND TIP GREEN BEAV (BLADE) IMPLANT
BLADE SURG 15 STRL LF DISP TIS (BLADE) ×1 IMPLANT
BLADE SURG 15 STRL SS (BLADE) ×4
BNDG CMPR 9X4 STRL LF SNTH (GAUZE/BANDAGES/DRESSINGS) ×1
BNDG COHESIVE 3X5 TAN STRL LF (GAUZE/BANDAGES/DRESSINGS) ×2 IMPLANT
BNDG COHESIVE 4X5 TAN STRL (GAUZE/BANDAGES/DRESSINGS) ×2 IMPLANT
BNDG ESMARK 4X9 LF (GAUZE/BANDAGES/DRESSINGS) ×2 IMPLANT
CLSR STERI-STRIP ANTIMIC 1/2X4 (GAUZE/BANDAGES/DRESSINGS) ×2 IMPLANT
CORD BIPOLAR FORCEPS 12FT (ELECTRODE) ×2 IMPLANT
COVER BACK TABLE 60X90IN (DRAPES) ×2 IMPLANT
CUFF TOURNIQUET SINGLE 18IN (TOURNIQUET CUFF) ×1 IMPLANT
DECANTER SPIKE VIAL GLASS SM (MISCELLANEOUS) ×1 IMPLANT
DRAPE EXTREMITY T 121X128X90 (DRAPE) ×2 IMPLANT
DRAPE IMP U-DRAPE 54X76 (DRAPES) ×2 IMPLANT
DRAPE OEC MINIVIEW 54X84 (DRAPES) ×2 IMPLANT
DRAPE SURG 17X23 STRL (DRAPES) ×2 IMPLANT
DURAPREP 26ML APPLICATOR (WOUND CARE) ×2 IMPLANT
GAUZE SPONGE 4X4 12PLY STRL (GAUZE/BANDAGES/DRESSINGS) ×2 IMPLANT
GAUZE XEROFORM 1X8 LF (GAUZE/BANDAGES/DRESSINGS) ×2 IMPLANT
GLOVE BIO SURGEON STRL SZ8 (GLOVE) ×3 IMPLANT
GLOVE BIOGEL PI IND STRL 7.0 (GLOVE) IMPLANT
GLOVE BIOGEL PI IND STRL 8 (GLOVE) ×2 IMPLANT
GLOVE BIOGEL PI INDICATOR 7.0 (GLOVE) ×1
GLOVE BIOGEL PI INDICATOR 8 (GLOVE) ×2
GLOVE ECLIPSE 6.5 STRL STRAW (GLOVE) ×1 IMPLANT
GLOVE ORTHO TXT STRL SZ7.5 (GLOVE) ×3 IMPLANT
GOWN STRL REUS W/ TWL LRG LVL3 (GOWN DISPOSABLE) ×1 IMPLANT
GOWN STRL REUS W/ TWL XL LVL3 (GOWN DISPOSABLE) ×2 IMPLANT
GOWN STRL REUS W/TWL LRG LVL3 (GOWN DISPOSABLE) ×2
GOWN STRL REUS W/TWL XL LVL3 (GOWN DISPOSABLE) ×4
K-WIRE 1.6 (WIRE) ×4
K-WIRE FX5X1.6XNS BN SS (WIRE) ×2
KWIRE FX5X1.6XNS BN SS (WIRE) IMPLANT
NDL HYPO 25X1 1.5 SAFETY (NEEDLE) IMPLANT
NEEDLE HYPO 25X1 1.5 SAFETY (NEEDLE) IMPLANT
NS IRRIG 1000ML POUR BTL (IV SOLUTION) ×2 IMPLANT
PACK BASIN DAY SURGERY FS (CUSTOM PROCEDURE TRAY) ×2 IMPLANT
PAD CAST 3X4 CTTN HI CHSV (CAST SUPPLIES) ×1 IMPLANT
PAD CAST 4YDX4 CTTN HI CHSV (CAST SUPPLIES) IMPLANT
PADDING CAST ABS 4INX4YD NS (CAST SUPPLIES) ×1
PADDING CAST ABS COTTON 4X4 ST (CAST SUPPLIES) ×1 IMPLANT
PADDING CAST COTTON 3X4 STRL (CAST SUPPLIES) ×2
PADDING CAST COTTON 4X4 STRL (CAST SUPPLIES)
PEG SUBCHONDRAL SMOOTH 2.0X16 (Peg) ×3 IMPLANT
PEG SUBCHONDRAL SMOOTH 2.0X18 (Peg) ×2 IMPLANT
PEG SUBCHONDRAL SMOOTH 2.0X20 (Peg) ×1 IMPLANT
PLATE SHORT 21.6X48.9 NRRW LT (Plate) ×1 IMPLANT
SCREW BN 12X3.5XNS CORT TI (Screw) IMPLANT
SCREW CORT 3.5X10 LNG (Screw) ×1 IMPLANT
SCREW CORT 3.5X12 (Screw) ×2 IMPLANT
SLEEVE SCD COMPRESS KNEE MED (MISCELLANEOUS) ×2 IMPLANT
SLING ARM FOAM STRAP MED (SOFTGOODS) ×1 IMPLANT
SPLINT PLASTER CAST XFAST 3X15 (CAST SUPPLIES) IMPLANT
SPLINT PLASTER XTRA FASTSET 3X (CAST SUPPLIES)
SUCTION FRAZIER HANDLE 10FR (MISCELLANEOUS) ×1
SUCTION TUBE FRAZIER 10FR DISP (MISCELLANEOUS) ×1 IMPLANT
SUT ETHILON 3 0 PS 1 (SUTURE) IMPLANT
SUT ETHILON 4 0 PS 2 18 (SUTURE) ×1 IMPLANT
SUT MNCRL AB 4-0 PS2 18 (SUTURE) IMPLANT
SUT VIC AB 0 CT1 27 (SUTURE)
SUT VIC AB 0 CT1 27XBRD ANBCTR (SUTURE) IMPLANT
SUT VICRYL 3-0 CR8 SH (SUTURE) ×2 IMPLANT
SYR BULB 3OZ (MISCELLANEOUS) ×2 IMPLANT
SYR CONTROL 10ML LL (SYRINGE) IMPLANT
TOWEL OR 17X24 6PK STRL BLUE (TOWEL DISPOSABLE) ×2 IMPLANT
TOWEL OR NON WOVEN STRL DISP B (DISPOSABLE) ×2 IMPLANT
TUBE CONNECTING 20X1/4 (TUBING) ×2 IMPLANT
UNDERPAD 30X30 (UNDERPADS AND DIAPERS) ×1 IMPLANT

## 2017-07-07 NOTE — Anesthesia Procedure Notes (Signed)
Procedure Name: LMA Insertion Date/Time: 07/07/2017 9:08 AM Performed by: Lyndee Leo Pre-anesthesia Checklist: Patient identified, Emergency Drugs available, Suction available and Patient being monitored Patient Re-evaluated:Patient Re-evaluated prior to induction Oxygen Delivery Method: Circle system utilized Preoxygenation: Pre-oxygenation with 100% oxygen Induction Type: IV induction Ventilation: Mask ventilation without difficulty LMA: LMA inserted LMA Size: 3.0 Number of attempts: 1 Airway Equipment and Method: Bite block Placement Confirmation: positive ETCO2 Tube secured with: Tape Dental Injury: Teeth and Oropharynx as per pre-operative assessment  Comments: Teeth remain as pre-op. Gauze bite bolock in place.

## 2017-07-07 NOTE — Anesthesia Procedure Notes (Signed)
Anesthesia Regional Block: Supraclavicular block   Pre-Anesthetic Checklist: ,, timeout performed, Correct Patient, Correct Site, Correct Laterality, Correct Procedure,, site marked, risks and benefits discussed, Surgical consent,  Pre-op evaluation,  At surgeon's request and post-op pain management  Laterality: Left  Prep: chloraprep       Needles:  Injection technique: Single-shot  Needle Type: Echogenic Stimulator Needle     Needle Length: 5cm  Needle Gauge: 22     Additional Needles:   Procedures:, nerve stimulator,,,, intact distal pulses,,,  Narrative:  Start time: 07/07/2017 8:13 AM End time: 07/07/2017 8:23 AM Injection made incrementally with aspirations every 5 mL.  Performed by: Personally   Additional Notes: Functioning IV was confirmed and monitors applied.  A 11mm 22ga echogenic arrow stimulator was used. Sterile prep and drape,hand hygiene and sterile gloves were used.Ultrasound guidance: relevent anatomy identified, needle position confirmed, local anesthetic spread visualized around nerve(s)., vascular puncture avoided.  Image printed for medical record.  Negative aspiration and negative test dose prior to incremental administration of local anesthetic. The patient tolerated the procedure well.

## 2017-07-07 NOTE — Anesthesia Postprocedure Evaluation (Signed)
Anesthesia Post Note  Patient: Alicia Rice  Procedure(s) Performed: OPEN REDUCTION INTERNAL FIXATION (ORIF) LEFT DISTAL RADIAL FRACTURE (Left Wrist) LEFT CARPAL TUNNEL RELEASE (Left Wrist)     Patient location during evaluation: PACU Anesthesia Type: General Level of consciousness: sedated Pain management: pain level controlled Vital Signs Assessment: post-procedure vital signs reviewed and stable Respiratory status: spontaneous breathing and respiratory function stable Cardiovascular status: stable Postop Assessment: no apparent nausea or vomiting Anesthetic complications: no    Last Vitals:  Vitals:   07/07/17 1100 07/07/17 1115  BP: 124/77   Pulse: 78 79  Resp: 16 18  Temp:    SpO2: 93% 94%    Last Pain:  Vitals:   07/07/17 1115  TempSrc:   PainSc: 3                  Gloyd Happ DANIEL

## 2017-07-07 NOTE — Op Note (Addendum)
07/07/2017  8:55 AM  PATIENT:  Alicia Rice    PRE-OPERATIVE DIAGNOSIS:  Fracture of the lower end of left radius, initial encounter for closed fracture, Carpal tunnel syndrome, left upper limb    POST-OPERATIVE DIAGNOSIS:  Same  PROCEDURE:  OPEN REDUCTION INTERNAL FIXATION (ORIF) LEFT DISTAL RADIAL FRACTURE 2 pieces, LEFT CARPAL TUNNEL RELEASE  SURGEON:  [name], MD  PHYSICIAN ASSISTANT: Joya Gaskins, OPA-C, present and scrubbed throughout the case, critical for completion in a timely fashion, and for retraction, instrumentation, and closure.  ANESTHESIA:   General with regional block  ESTIMATED BLOOD LOSS: minimal   OPERATIVE IMPLANTS: Biomet DVR volar plate with 2 proximal cortical screws and multiple distal interlocking smooth pegs, using the short narrow plate.   PREOPERATIVE INDICATIONS:  Alicia Rice is a  56 y.o. female   who had who fell and broke her left wrist and had significant displacement, initially it was minimally displaced at urgent care but subsequently presented with increased dorsal angulation, and she was also having some progressive carpal tunnel type nerve symptoms, who failed conservative measures and elected for surgical management.    The risks benefits and alternatives were discussed with the patient preoperatively including but not limited to the risks of infection, bleeding, nerve injury, incomplete relief of symptoms, pillar pain, cardiopulmonary complications, the need for revision surgery, among others, and the patient was willing to proceed.  We also discussed the risks for tendon rupture, failure of fixation, nonunion, malunion, need for revision surgery, among others.  I also counseled her to quit smoking.  OPERATIVE FINDINGS: Median nerve had relatively normal appearance, it did not look significantly traumatized.  It was released along the carpal canal and to the distal forearm fascia.  The distal radius had significant comminution  with shortening and angulation, bone quality was mediocre.   OPERATIVE PROCEDURE: The patient is brought to the operating room placed in the supine position. General anesthesia was administered. The upper extremity was prepped and draped in usual sterile fashion. Time out was performed. The arm was elevated and exsanguinated and the tourniquet was inflated. Incision was made in line with the radial border of the ring finger. The carpal tunnel transverse fascia was identified, cleaned, and incised sharply. The common sensory branches were visualized along with the superficial palmar arch and protected.  The median nerve was protected below. Deep retractors were placed underneath the transverse carpal ligament, protecting the nerve. I released the ligament completely, and then released the proximal distal volar forearm fascia. The nerve was identified, and visualized, and protected throughout the case. No masses or abnormalities were identified in ulnar bursa.    I then turned my attention to the distal radius.  Volar approach to the distal radius was carried out, and the flexor carpi radialis was retracted radially. The radial artery was protected throughout the case.   Deep dissection was carried down, and the pronator quadratus was elevated off of the radius. The fracture site was identified and cleaned and reduced anatomically. This keyed into place nicely.   I held this provisionally with a K wire, and C-arm used to confirm alignment.   I had restored height and inclination and then applied a volar plate. A K wire was used to confirm appropriate position of the plate, and once I was satisfied with the overall alignment I was able to secure the plate proximally with a cortical screw.   I then secured the fracture with multiple smooth interlocking pegs distally, and confirmed  that none of these were in the joint, and none of these were penetrating the dorsal cortex. I also secured the plate proximally  with one more cortical screw.   The wounds were irrigated copiously, and I used 3-0 subcutaneous Vicryl for the skin and Steri-Strips and sterile gauze and a volar splint. The carpal tunnel incision was closed with nylon.  The tourniquet was released. She was awakened and returned back in stable and satisfactory condition. There were no complications and She tolerated the procedure well.  3-Views of the left wrist were taken at the end of the case demonstrating anatomic reconstruction and appropriate fixation of the fractured distal radius.

## 2017-07-07 NOTE — H&P (Signed)
PREOPERATIVE H&P  Chief Complaint: Unspecified fracture of the lower end of unspecified radius, initial encounter for closed fracture, Carpal tunnel syndrome,left upper limb  S52.509A  G56.02  HPI: Alicia Rice is a 56 y.o. female who presents for preoperative history and physical with a diagnosis of Unspecified fracture of the lower end of unspecified radius, initial encounter for closed fracture, Carpal tunnel syndrome,left upper limb  S52.509A  G56.02. Symptoms are rated as moderate to severe, and have been worsening.  This is significantly impairing activities of daily living.  She has elected for surgical management. This occurred about a week ago,initially the fracture was minimally displaced seen in urgent care, then seen by Dr. Orvilla Fus tech and was developing progressive loss of position, and recommendation for surgical intervention was made.    Past Medical History:  Diagnosis Date  . Arthritis    lt knee  . Chronic back pain   . Depression   . Distal radius fracture, left   . GERD (gastroesophageal reflux disease)   . Hypertension   . Loose, teeth    bottom front  . PONV (postoperative nausea and vomiting)    Past Surgical History:  Procedure Laterality Date  . ELBOW SURGERY     nerve surgery  . FOOT SURGERY    . HYSTEROSCOPY    . MANDIBLE FRACTURE SURGERY    . NASAL SINUS SURGERY    . TEMPOROMANDIBULAR JOINT SURGERY    . TONSILLECTOMY     Social History   Social History  . Marital status: Married    Spouse name: N/A  . Number of children: N/A  . Years of education: N/A   Social History Main Topics  . Smoking status: Current Every Day Smoker    Packs/day: 0.25  . Smokeless tobacco: Never Used  . Alcohol use No  . Drug use: No  . Sexual activity: Not Asked   Other Topics Concern  . None   Social History Narrative  . None   Family History  Problem Relation Age of Onset  . Heart murmur Mother   . Hypertension Father   . Diabetes Father   .  Arthritis Father   . Prostate cancer Father   . Graves' disease Father    No Known Allergies Prior to Admission medications   Medication Sig Start Date End Date Taking? Authorizing Provider  amphetamine-dextroamphetamine (ADDERALL) 20 MG tablet  08/07/15  Yes [provider]  clotrimazole-betamethasone (LOTRISONE) cream Apply 1 application topically 2 (two) times daily. 10/21/15  Yes [name], MD  cyclobenzaprine (FLEXERIL) 10 MG tablet Take 10 mg by mouth 2 (two) times daily. 08/01/15  Yes [provider]  DULoxetine (CYMBALTA) 60 MG capsule Take by mouth daily.    Yes [provider]  gabapentin (NEURONTIN) 600 MG tablet 600 mg 3 (three) times daily.  08/19/15  Yes [provider]  losartan-hydrochlorothiazide (HYZAAR) 100-25 MG tablet TAKE 1 TABLET BY MOUTH DAILY. 07/11/16  Yes [name], MD  Oxycodone HCl 10 MG TABS TAKE 1 TAB BY MOUTH 4 TIMES A DAY AS NEEDED FOR PAIN 08/21/15  Yes [provider]  ranitidine (ZANTAC) 150 MG capsule Take 150 mg by mouth daily.   Yes [provider]     Positive ROS: All other systems have been reviewed and were otherwise negative with the exception of those mentioned in the HPI and as above.  Physical Exam: General: Alert, no acute distress Cardiovascular: No pedal edema Respiratory: No cyanosis,  no use of accessory musculature GI: No organomegaly, abdomen is soft and non-tender Skin: No lesions in the area of chief complaint Neurologic: Sensation intact distally Psychiatric: Patient is competent for consent with normal mood and affect Lymphatic: No axillary or cervical lymphadenopathy  MUSCULOSKELETAL: left hand has subjective paresthesias in the median nerve distribution although she can feel. All fingers do flex extend and abduct, mild deformity at the fracture site.  Assessment: Unspecified fracture of the lower end of unspecified radius, initial encounter for  closed fracture, Carpal tunnel syndrome,left upper limb  S52.509A  G56.02   Plan: Plan for Procedure(s): OPEN REDUCTION INTERNAL FIXATION (ORIF) LEFT DISTAL RADIAL FRACTURE LEFT CARPAL TUNNEL RELEASE  The risks benefits and alternatives were discussed with the patient including but not limited to the risks of nonoperative treatment, versus surgical intervention including infection, bleeding, nerve injury, malunion, nonunion, the need for revision surgery, hardware prominence, hardware failure, the need for hardware removal, blood clots, cardiopulmonary complications, morbidity, mortality, among others, and they were willing to proceed.     [name], MD Cell [phone]   07/07/2017 7:39 AM

## 2017-07-07 NOTE — Anesthesia Preprocedure Evaluation (Addendum)
Anesthesia Evaluation  Patient identified by MRN, date of birth, ID band Patient awake    Reviewed: Allergy & Precautions, NPO status , Patient's Chart, lab work & pertinent test results  History of Anesthesia Complications (+) PONV and history of anesthetic complications  Airway Mallampati: II  TM Distance: >3 FB     Dental  (+) Poor Dentition, Loose, Dental Advisory Given   Pulmonary Current Smoker,    Pulmonary exam normal        Cardiovascular hypertension, Normal cardiovascular exam     Neuro/Psych  Headaches, PSYCHIATRIC DISORDERS Anxiety Depression    GI/Hepatic Neg liver ROS, GERD  ,  Endo/Other  negative endocrine ROS  Renal/GU negative Renal ROS     Musculoskeletal   Abdominal   Peds  Hematology negative hematology ROS (+)   Anesthesia Other Findings   Reproductive/Obstetrics                            Anesthesia Physical Anesthesia Plan  ASA: II  Anesthesia Plan: General   Post-op Pain Management:    Induction:   PONV Risk Score and Plan: 3 and Ondansetron, Dexamethasone and Diphenhydramine  Airway Management Planned: LMA  Additional Equipment:   Intra-op Plan:   Post-operative Plan: Extubation in OR  Informed Consent: I have reviewed the patients History and Physical, chart, labs and discussed the procedure including the risks, benefits and alternatives for the proposed anesthesia with the patient or authorized representative who has indicated his/her understanding and acceptance.   Dental advisory given  Plan Discussed with: CRNA, Anesthesiologist and Surgeon  Anesthesia Plan Comments:        Anesthesia Quick Evaluation

## 2017-07-07 NOTE — Progress Notes (Signed)
Assisted Dr. Singer with left, ultrasound guided, supraclavicular block. Side rails up, monitors on throughout procedure. See vital signs in flow sheet. Tolerated Procedure well. 

## 2017-07-07 NOTE — Discharge Instructions (Signed)
Diet: As you were doing prior to hospitalization   Shower:  May shower but keep the wounds dry, use an occlusive plastic wrap, NO SOAKING IN TUB.  If the bandage gets wet, change with a clean dry gauze.  If you have a splint on, leave the splint in place and keep the splint dry with a plastic bag.  Dressing:  You may change your dressing 3-5 days after surgery, unless you have a splint.  If you have a splint, then just leave the splint in place and we will change your bandages during your first follow-up appointment.    If you had hand or foot surgery, we will plan to remove your stitches in about 2 weeks in the office.  For all other surgeries, there are sticky tapes (steri-strips) on your wounds and all the stitches are absorbable.  Leave the steri-strips in place when changing your dressings, they will peel off with time, usually 2-3 weeks.  Activity:  Increase activity slowly as tolerated, but follow the weight bearing instructions below.  The rules on driving is that you can not be taking narcotics while you drive, and you must feel in control of the vehicle.    Weight Bearing:   No weight on left hand.  Keep hand elevated above elbow.  No lifting with left hand.  To prevent constipation: you may use a stool softener such as -  Colace (over the counter) 100 mg by mouth twice a day  Drink plenty of fluids (prune juice may be helpful) and high fiber foods Miralax (over the counter) for constipation as needed.    Itching:  If you experience itching with your medications, try taking only a single pain pill, or even half a pain pill at a time.  You may take up to 10 pain pills per day, and you can also use benadryl over the counter for itching or also to help with sleep.   Precautions:  If you experience chest pain or shortness of breath - call 911 immediately for transfer to the hospital emergency department!!  If you develop a fever greater that 101 F, purulent drainage from wound, increased  redness or drainage from wound, or calf pain -- Call the office at [phone]                                                Follow- Up Appointment:  Please call for an appointment to be seen in 2 weeks [place] - [phone]    Post Anesthesia Home Care Instructions  Activity: Get plenty of rest for the remainder of the day. A responsible individual must stay with you for 24 hours following the procedure.  For the next 24 hours, DO NOT: -Drive a car -Paediatric nurse -Drink alcoholic beverages -Take any medication unless instructed by your physician -Make any legal decisions or sign important papers.  Meals: Start with liquid foods such as gelatin or soup. Progress to regular foods as tolerated. Avoid greasy, spicy, heavy foods. If nausea and/or vomiting occur, drink only clear liquids until the nausea and/or vomiting subsides. Call your physician if vomiting continues.  Special Instructions/Symptoms: Your throat may feel dry or sore from the anesthesia or the breathing tube placed in your throat during surgery. If this causes discomfort, gargle with warm salt water. The discomfort should disappear within 24 hours.  If you  had a scopolamine patch placed behind your ear for the management of post- operative nausea and/or vomiting:  1. The medication in the patch is effective for 72 hours, after which it should be removed.  Wrap patch in a tissue and discard in the trash. Wash hands thoroughly with soap and water. 2. You may remove the patch earlier than 72 hours if you experience unpleasant side effects which may include dry mouth, dizziness or visual disturbances. 3. Avoid touching the patch. Wash your hands with soap and water after contact with the patch.     Regional Anesthesia Blocks  1. Numbness or the inability to move the "blocked" extremity may last from 3-48 hours after placement. The length of time depends on the medication injected and your individual response to  the medication. If the numbness is not going away after 48 hours, call your surgeon.  2. The extremity that is blocked will need to be protected until the numbness is gone and the  Strength has returned. Because you cannot feel it, you will need to take extra care to avoid injury. Because it may be weak, you may have difficulty moving it or using it. You may not know what position it is in without looking at it while the block is in effect.  3. For blocks in the legs and feet, returning to weight bearing and walking needs to be done carefully. You will need to wait until the numbness is entirely gone and the strength has returned. You should be able to move your leg and foot normally before you try and bear weight or walk. You will need someone to be with you when you first try to ensure you do not fall and possibly risk injury.  4. Bruising and tenderness at the needle site are common side effects and will resolve in a few days.  5. Persistent numbness or new problems with movement should be communicated to the surgeon or the [place] [phone] [place] [phone]).

## 2017-07-07 NOTE — Transfer of Care (Signed)
Immediate Anesthesia Transfer of Care Note  Patient: Alicia Rice  Procedure(s) Performed: OPEN REDUCTION INTERNAL FIXATION (ORIF) LEFT DISTAL RADIAL FRACTURE (Left Wrist) LEFT CARPAL TUNNEL RELEASE (Left Wrist)  Patient Location: PACU  Anesthesia Type:GA combined with regional for post-op pain  Level of Consciousness: awake, sedated and patient cooperative  Airway & Oxygen Therapy: Patient Spontanous Breathing and Patient connected to face mask oxygen  Post-op Assessment: Report given to RN and Post -op Vital signs reviewed and stable  Post vital signs: Reviewed and stable  Last Vitals:  Vitals:   07/07/17 0824 07/07/17 0825  BP:    Pulse: 74 69  Resp: 17 13  Temp:    SpO2: 100% 98%    Last Pain:  Vitals:   07/07/17 0751  TempSrc: Oral  PainSc: 4       Patients Stated Pain Goal: 4 (89/78/47 8412)  Complications: No apparent anesthesia complications

## 2017-07-10 ENCOUNTER — Encounter (HOSPITAL_BASED_OUTPATIENT_CLINIC_OR_DEPARTMENT_OTHER): Payer: Self-pay | Admitting: Orthopedic Surgery

## 2017-10-24 ENCOUNTER — Telehealth: Payer: Self-pay | Admitting: Family Medicine

## 2017-10-24 NOTE — Telephone Encounter (Signed)
Received Medical Clearance form form [facility] office on patient this was put in providers box. Thanks CC

## 2017-10-24 NOTE — Telephone Encounter (Signed)
Can you please schedule her for surgical clearance Thanks

## 2017-11-06 ENCOUNTER — Encounter: Payer: Self-pay | Admitting: Family Medicine

## 2017-11-06 ENCOUNTER — Ambulatory Visit (INDEPENDENT_AMBULATORY_CARE_PROVIDER_SITE_OTHER): Payer: BLUE CROSS/BLUE SHIELD | Admitting: Family Medicine

## 2017-11-06 VITALS — BP 168/72 | HR 72 | Temp 98.4°F | Resp 16 | Wt 165.0 lb

## 2017-11-06 DIAGNOSIS — M1990 Unspecified osteoarthritis, unspecified site: Secondary | ICD-10-CM | POA: Diagnosis not present

## 2017-11-06 DIAGNOSIS — N951 Menopausal and female climacteric states: Secondary | ICD-10-CM

## 2017-11-06 DIAGNOSIS — R232 Flushing: Secondary | ICD-10-CM

## 2017-11-06 DIAGNOSIS — I10 Essential (primary) hypertension: Secondary | ICD-10-CM

## 2017-11-06 DIAGNOSIS — F1020 Alcohol dependence, uncomplicated: Secondary | ICD-10-CM

## 2017-11-06 MED ORDER — AMLODIPINE BESYLATE 5 MG PO TABS: 5.0000 mg | ORAL_TABLET | Freq: Every day | ORAL | 3 refills | Status: DC

## 2017-11-06 NOTE — Progress Notes (Signed)
Patient: Alicia Rice Female    DOB: 04-23-1961   57 y.o.   MRN: [id number] Visit Date: 11/06/2017  Today's Provider: [name], MD   Chief Complaint  Patient presents with  . Hypertension  . Hot Flashes   Subjective:    HPI Patient comes in today for a follow up on HTN. She was last seen in the office on 05/11/16. No changes were made in her medications. She reports that she has not been taking losartan/HCTZ due to muscle aches.   Patient also c/o hot flashes that come and goes. She reports that her hot flashes lasts for hours at a time.   She also mentions that she has "blood blisters" on her lower legs. She thinks it came from the swelling that she had in both legs.      No Known Allergies   Current Outpatient Medications:  .  amphetamine-dextroamphetamine (ADDERALL) 20 MG tablet, , Disp: , Rfl:  .  clotrimazole-betamethasone (LOTRISONE) cream, Apply 1 application topically 2 (two) times daily., Disp: 30 g, Rfl: 1 .  cyclobenzaprine (FLEXERIL) 10 MG tablet, Take 10 mg by mouth 2 (two) times daily., Disp: , Rfl: 0 .  DULoxetine (CYMBALTA) 60 MG capsule, Take by mouth daily. , Disp: , Rfl:  .  Oxycodone HCl 10 MG TABS, Take 1-2 tablets (10-20 mg total) by mouth 4 (four) times daily as needed., Disp: 50 tablet, Rfl: 0 .  ranitidine (ZANTAC) 150 MG capsule, Take 150 mg by mouth daily., Disp: , Rfl:  .  gabapentin (NEURONTIN) 600 MG tablet, 600 mg 3 (three) times daily. Up to 6 times daily, Disp: , Rfl:  .  HYDROmorphone (DILAUDID) 2 MG tablet, Take 1 tablet (2 mg total) by mouth every 4 (four) hours as needed for severe pain. (Patient not taking: Reported on 11/06/2017), Disp: 30 tablet, Rfl: 0 .  losartan-hydrochlorothiazide (HYZAAR) 100-25 MG tablet, TAKE 1 TABLET BY MOUTH DAILY. (Patient not taking: Reported on 11/06/2017), Disp: 30 tablet, Rfl: 6 .  sennosides-docusate sodium (SENOKOT-S) 8.6-50 MG tablet, Take 2 tablets by mouth daily. (Patient not taking:  Reported on 11/06/2017), Disp: 30 tablet, Rfl: 1  Review of Systems  Constitutional: Negative.   HENT: Negative.   Eyes: Negative.   Respiratory: Negative for apnea, cough, choking, chest tightness, shortness of breath, wheezing and stridor.   Cardiovascular: Positive for leg swelling. Negative for chest pain and palpitations.  Endocrine: Negative.   Musculoskeletal: Positive for arthralgias and back pain.  Skin: Positive for color change.  Neurological: Positive for numbness. Negative for dizziness, light-headedness and headaches.  Psychiatric/Behavioral: Negative.     Social History   Tobacco Use  . Smoking status: Current Every Day Smoker    Packs/day: 0.25  . Smokeless tobacco: Never Used  Substance Use Topics  . Alcohol use: No   Objective:   BP (!) 168/72 (BP Location: Right Arm, Patient Position: Sitting, Cuff Size: Normal)   Pulse 72   Temp 98.4 F (36.9 C)   Resp 16   Wt 165 lb (74.8 kg)   BMI 26.63 kg/m  Vitals:   11/06/17 1626  BP: (!) 168/72  Pulse: 72  Resp: 16  Temp: 98.4 F (36.9 C)  Weight: 165 lb (74.8 kg)     Physical Exam  Constitutional: She is oriented to person, place, and time. She appears well-developed and well-nourished.  HENT:  Head: Normocephalic and atraumatic.  Eyes: Conjunctivae are normal. No scleral icterus.  Neck: No  thyromegaly present.  Cardiovascular: Normal rate, regular rhythm and normal heart sounds.  Pulmonary/Chest: Effort normal and breath sounds normal.  Abdominal: Soft.  Musculoskeletal:  Boot on right foot.  Neurological: She is alert and oriented to person, place, and time.  Skin: Skin is warm and dry. Rash noted.  Rash on lower legs c/w old petechia which are now healing.   Psychiatric: She has a normal mood and affect. Her behavior is normal.        Assessment & Plan:     1. Essential hypertension RTC 2-3 weeks.Advised of possible swelling of feet. - amLODipine (NORVASC) 5 MG tablet; Take 1 tablet (5 mg  total) by mouth daily.  Dispense: 90 tablet; Refill: 3 - CBC with Differential/Platelet - Comprehensive metabolic panel - Lipid panel - TSH  2. Hot flashes/menopause - Follicle stimulating hormone - Luteinizing hormone 3.Rash Check CBC for clotting disorder. Normal exam otherwise. 4.Ortho Pt has right foot fusion and left knee surgery scheduled later this year. 5.Alcoholism Pt states she has not had a drink in 2.5 years.     I have done the exam and reviewed the above chart and it is accurate to the best of my knowledge. Development worker, community has been used in this note in any air is in the dictation or transcription are unintentional.  [name], MD  [place]

## 2017-11-15 ENCOUNTER — Other Ambulatory Visit: Payer: Self-pay | Admitting: Orthopedic Surgery

## 2017-11-16 NOTE — Pre-Procedure Instructions (Signed)
Alicia Rice  11/16/2017      [id number] [address], [place] [address] Phone: [phone] Fax: [phone]  [facility] [place], [place] [address] [place] [address] Phone: [phone] Fax: [phone]    Your procedure is scheduled on Tuesday, March 12th   Report to [facility] Admitting at 8:45 AM             (posted surgery time 10:45a - 12:45p)   Call this number if you have problems the morning of surgery:  [phone]   Remember:              4-5 days prior to surgery, STOP TAKING any Vitamins, Herbal Supplements, Anti=inflammatories.   Do not eat food or drink liquids after midnight, Monday.   Take these medicines the morning of surgery with A SIP OF WATER : Amlodipine, Cymbalta, Gabapentin, Oxycodone   Do not wear jewelry, make-up or nail polish.  Do not wear lotions, powders,  perfumes, or deodorant.  Do not shave 48 hours prior to surgery.    Do not bring valuables to the hospital.  [facility] is not responsible for any belongings or valuables.  Contacts, dentures or bridgework may not be worn into surgery.  Leave your suitcase in the car.  After surgery it may be brought to your room.  For patients admitted to the hospital, discharge time will be determined by your treatment team.  Please read over the following fact sheets that you were given. Pain Booklet, MRSA Information and Surgical Site Infection Prevention      [place]- Preparing For Surgery  Before surgery, you can play an important role. Because skin is not sterile, your skin needs to be as free of germs as possible. You can reduce the number of germs on your skin by washing with CHG (chlorahexidine gluconate) Soap before surgery.  CHG is an antiseptic cleaner which kills germs and bonds with the skin to continue killing germs even after washing.  Please do  not use if you have an allergy to CHG or antibacterial soaps. If your skin becomes reddened/irritated stop using the CHG.  Do not shave (including legs and underarms) for at least 48 hours prior to first CHG shower. It is OK to shave your face.  Please follow these instructions carefully.   1. Shower the NIGHT BEFORE SURGERY and the MORNING OF SURGERY with CHG.   2. If you chose to wash your hair, wash your hair first as usual with your normal shampoo.  3. After you shampoo, rinse your hair and body thoroughly to remove the shampoo.  4. Use CHG as you would any other liquid soap. You can apply CHG directly to the skin and wash gently with a scrungie or a clean washcloth.   5. Apply the CHG Soap to your body ONLY FROM THE NECK DOWN.  Do not use on open wounds or open sores. Avoid contact with your eyes, ears, mouth and genitals (private parts). Wash Face and genitals (private parts)  with your normal soap.  6. Wash thoroughly, paying special attention to the area where your surgery will be performed.  7. Thoroughly rinse your body with warm water from the neck down.  8. DO NOT shower/wash with your normal soap after using and rinsing off the CHG Soap.  9. Pat yourself dry with a CLEAN TOWEL.  10. Wear CLEAN PAJAMAS to bed the night before surgery, wear comfortable clothes the morning of surgery  11. Place CLEAN SHEETS on your bed the night of your first shower and DO NOT SLEEP WITH PETS.    Day of Surgery: Do not apply any deodorants/lotions. Please wear clean clothes to the hospital/surgery center.

## 2017-11-17 ENCOUNTER — Other Ambulatory Visit: Payer: Self-pay

## 2017-11-17 ENCOUNTER — Encounter (HOSPITAL_COMMUNITY)
Admission: RE | Admit: 2017-11-17 | Discharge: 2017-11-17 | Disposition: A | Discharge status: Home / Self Care | Payer: BLUE CROSS/BLUE SHIELD | Source: Ambulatory Visit | Attending: Orthopedic Surgery | Admitting: Orthopedic Surgery

## 2017-11-17 ENCOUNTER — Encounter (HOSPITAL_COMMUNITY): Payer: Self-pay

## 2017-11-17 DIAGNOSIS — Z01818 Encounter for other preprocedural examination: Secondary | ICD-10-CM | POA: Insufficient documentation

## 2017-11-17 LAB — BASIC METABOLIC PANEL
Anion gap: 10 (ref 5–15)
BUN: 29 mg/dL — ABNORMAL HIGH (ref 6–20)
CO2: 25 mmol/L (ref 22–32)
Calcium: 9.4 mg/dL (ref 8.9–10.3)
Chloride: 101 mmol/L (ref 101–111)
Creatinine, Ser: 0.72 mg/dL (ref 0.44–1.00)
GFR calc Af Amer: >60 mL/min (ref >60)
GFR calc non Af Amer: >60 mL/min (ref >60)
Glucose, Bld: 119 mg/dL — ABNORMAL HIGH (ref 65–99)
Potassium: 4.7 mmol/L (ref 3.5–5.1)
Sodium: 136 mmol/L (ref 135–145)

## 2017-11-17 LAB — CBC
HCT: 42.7 % (ref 36.0–46.0)
Hemoglobin: 14.5 g/dL (ref 12.0–15.0)
MCH: 31.5 pg (ref 26.0–34.0)
MCHC: 34 g/dL (ref 30.0–36.0)
MCV: 92.8 fL (ref 78.0–100.0)
Platelets: 267 10*3/uL (ref 150–400)
RBC: 4.6 MIL/uL (ref 3.87–5.11)
RDW: 13 % (ref 11.5–15.5)
WBC: 6.2 10*3/uL (ref 4.0–10.5)

## 2017-11-17 LAB — SURGICAL PCR SCREEN
MRSA, PCR: NEGATIVE
Staphylococcus aureus: NEGATIVE

## 2017-11-17 NOTE — Progress Notes (Signed)
PCP is Dr. Miguel Aschoff  LOV 10/2017 Denies murmur, cardiac issues or tests

## 2017-11-21 ENCOUNTER — Ambulatory Visit: Payer: Self-pay | Admitting: Family Medicine

## 2017-11-21 NOTE — Telephone Encounter (Signed)
Left message for patient to call to schedule surgery clearance.  She is having surgery on 11/28/2017.  Alicia Rice has form on her desk.

## 2017-11-23 ENCOUNTER — Ambulatory Visit (INDEPENDENT_AMBULATORY_CARE_PROVIDER_SITE_OTHER): Payer: BLUE CROSS/BLUE SHIELD | Admitting: Family Medicine

## 2017-11-23 VITALS — BP 144/92 | HR 94 | Temp 98.6°F | Resp 18 | Wt 162.0 lb

## 2017-11-23 DIAGNOSIS — F324 Major depressive disorder, single episode, in partial remission: Secondary | ICD-10-CM

## 2017-11-23 DIAGNOSIS — I10 Essential (primary) hypertension: Secondary | ICD-10-CM | POA: Diagnosis not present

## 2017-11-23 DIAGNOSIS — M1990 Unspecified osteoarthritis, unspecified site: Secondary | ICD-10-CM

## 2017-11-23 DIAGNOSIS — Z01818 Encounter for other preprocedural examination: Secondary | ICD-10-CM | POA: Diagnosis not present

## 2017-11-23 DIAGNOSIS — K219 Gastro-esophageal reflux disease without esophagitis: Secondary | ICD-10-CM

## 2017-11-23 DIAGNOSIS — F411 Generalized anxiety disorder: Secondary | ICD-10-CM

## 2017-11-23 NOTE — Progress Notes (Signed)
Patient: Alicia Rice Female    DOB: 1961-07-10   57 y.o.   MRN: [id number] Visit Date: 11/23/2017  Today's Provider: [name], MD   Chief Complaint  Patient presents with  . Medical Clearance   Subjective:    HPI Pt is here today for surgical clearance. She reports that she is feeling well physically. She has anxiety and depression that is a little worse with the pain. But over all is doing ok. Pt denies, shortness of breath, numbness, tingling, headache, or weakness. She has never had any problems with anesthesia.      No Known Allergies   Current Outpatient Medications:  .  amLODipine (NORVASC) 5 MG tablet, Take 1 tablet (5 mg total) by mouth daily., Disp: 90 tablet, Rfl: 3 .  amphetamine-dextroamphetamine (ADDERALL) 20 MG tablet, Take 20 mg by mouth 3 (three) times daily. , Disp: , Rfl:  .  cyclobenzaprine (FLEXERIL) 10 MG tablet, Take 20 mg by mouth at bedtime. , Disp: , Rfl: 0 .  DULoxetine (CYMBALTA) 60 MG capsule, Take 60 mg by mouth 2 (two) times daily. , Disp: , Rfl:  .  famotidine (PEPCID) 20 MG tablet, Take 20 mg by mouth 2 (two) times daily as needed for heartburn or indigestion., Disp: , Rfl:  .  gabapentin (NEURONTIN) 600 MG tablet, Take 300-600 mg by mouth every 4 (four) hours as needed (for pain/anxiety). Up to 6 times daily, Disp: , Rfl:  .  Oxycodone HCl 10 MG TABS, Take 1-2 tablets (10-20 mg total) by mouth 4 (four) times daily as needed. (Patient taking differently: Take 10-20 mg by mouth 4 (four) times daily as needed (for pain.). ), Disp: 50 tablet, Rfl: 0 .  HYDROmorphone (DILAUDID) 2 MG tablet, Take 1 tablet (2 mg total) by mouth every 4 (four) hours as needed for severe pain. (Patient not taking: Reported on 11/06/2017), Disp: 30 tablet, Rfl: 0 .  oxyCODONE-acetaminophen (PERCOCET) 10-325 MG tablet, Take 1 tablet by mouth every 6 (six) hours as needed for pain., Disp: , Rfl: 0 .  sennosides-docusate sodium (SENOKOT-S) 8.6-50 MG tablet, Take 2  tablets by mouth daily. (Patient not taking: Reported on 11/06/2017), Disp: 30 tablet, Rfl: 1  Review of Systems  Constitutional: Negative.   HENT: Negative.   Eyes: Negative.   Respiratory: Negative.   Cardiovascular: Negative.   Gastrointestinal: Negative.   Endocrine: Negative.   Genitourinary: Negative.   Musculoskeletal: Positive for arthralgias.  Skin: Negative.   Allergic/Immunologic: Negative.   Neurological: Negative.   Hematological: Negative.   Psychiatric/Behavioral: Negative.     Social History   Tobacco Use  . Smoking status: Former Smoker    Packs/day: 0.25    Last attempt to quit: 07/03/2017    Years since quitting: 0.3  . Smokeless tobacco: Never Used  Substance Use Topics  . Alcohol use: No   Objective:   BP (!) 144/92 (BP Location: Right Arm, Patient Position: Sitting, Cuff Size: Normal)   Pulse 94   Temp 98.6 F (37 C) (Oral)   Resp 18   Wt 162 lb (73.5 kg)   SpO2 97%   BMI 26.15 kg/m  Vitals:   11/23/17 1114  BP: (!) 144/92  Pulse: 94  Resp: 18  Temp: 98.6 F (37 C)  TempSrc: Oral  SpO2: 97%  Weight: 162 lb (73.5 kg)     Physical Exam  Constitutional: She is oriented to person, place, and time. She appears well-developed and well-nourished.  HENT:  Head: Normocephalic and atraumatic.  Eyes: Conjunctivae are normal. No scleral icterus.  Neck: No thyromegaly present.  Cardiovascular: Normal rate, regular rhythm and normal heart sounds.  Pulmonary/Chest: Effort normal and breath sounds normal.  Abdominal: Soft.  Neurological: She is alert and oriented to person, place, and time.  Skin: Skin is warm and dry.  Psychiatric: She has a normal mood and affect. Her behavior is normal. Judgment and thought content normal.        Assessment & Plan:     1. Pre-operative clearance Medically cleared. - EKG 12-Lead 2.HTN 3.MDD/GAD     I have done the exam and reviewed the above chart and it is accurate to the best of my knowledge.  Development worker, community has been used in this note in any air is in the dictation or transcription are unintentional.  [name], MD  [place]

## 2017-11-25 LAB — COMPREHENSIVE METABOLIC PANEL
ALT: 23 IU/L (ref 0–32)
AST: 16 IU/L (ref 0–40)
Albumin/Globulin Ratio: 2.2 (ref 1.2–2.2)
Albumin: 5 g/dL (ref 3.5–5.5)
Alkaline Phosphatase: 93 IU/L (ref 39–117)
BUN/Creatinine Ratio: 25 — ABNORMAL HIGH (ref 9–23)
BUN: 17 mg/dL (ref 6–24)
Bilirubin Total: 0.6 mg/dL (ref 0.0–1.2)
CO2: 24 mmol/L (ref 20–29)
Calcium: 9.6 mg/dL (ref 8.7–10.2)
Chloride: 97 mmol/L (ref 96–106)
Creatinine, Ser: 0.68 mg/dL (ref 0.57–1.00)
GFR calc Af Amer: 113 mL/min/{1.73_m2} (ref >59)
GFR calc non Af Amer: 98 mL/min/{1.73_m2} (ref >59)
Globulin, Total: 2.3 g/dL (ref 1.5–4.5)
Glucose: 107 mg/dL — ABNORMAL HIGH (ref 65–99)
Potassium: 4.1 mmol/L (ref 3.5–5.2)
Sodium: 137 mmol/L (ref 134–144)
Total Protein: 7.3 g/dL (ref 6.0–8.5)

## 2017-11-25 LAB — CBC WITH DIFFERENTIAL/PLATELET
Basophils Absolute: 0.1 10*3/uL (ref 0.0–0.2)
Basos: 1 %
EOS (ABSOLUTE): 0.1 10*3/uL (ref 0.0–0.4)
Eos: 1 %
Hematocrit: 41.3 % (ref 34.0–46.6)
Hemoglobin: 14.2 g/dL (ref 11.1–15.9)
Immature Grans (Abs): 0 10*3/uL (ref 0.0–0.1)
Immature Granulocytes: 0 %
Lymphocytes Absolute: 2.6 10*3/uL (ref 0.7–3.1)
Lymphs: 31 %
MCH: 30.9 pg (ref 26.6–33.0)
MCHC: 34.4 g/dL (ref 31.5–35.7)
MCV: 90 fL (ref 79–97)
Monocytes Absolute: 0.7 10*3/uL (ref 0.1–0.9)
Monocytes: 9 %
Neutrophils Absolute: 5.1 10*3/uL (ref 1.4–7.0)
Neutrophils: 58 %
Platelets: 292 10*3/uL (ref 150–379)
RBC: 4.59 x10E6/uL (ref 3.77–5.28)
RDW: 13.2 % (ref 12.3–15.4)
WBC: 8.6 10*3/uL (ref 3.4–10.8)

## 2017-11-25 LAB — FOLLICLE STIMULATING HORMONE: FSH: 89.9 m[IU]/mL

## 2017-11-25 LAB — LIPID PANEL
Chol/HDL Ratio: 2.9 ratio (ref 0.0–4.4)
Cholesterol, Total: 157 mg/dL (ref 100–199)
HDL: 55 mg/dL (ref >39)
LDL Calculated: 95 mg/dL (ref 0–99)
Triglycerides: 35 mg/dL (ref 0–149)
VLDL Cholesterol Cal: 7 mg/dL (ref 5–40)

## 2017-11-25 LAB — LUTEINIZING HORMONE: LH: 44.4 m[IU]/mL

## 2017-11-25 LAB — TSH: TSH: 2.77 u[IU]/mL (ref 0.450–4.500)

## 2017-11-27 ENCOUNTER — Telehealth: Payer: Self-pay

## 2017-11-27 NOTE — Telephone Encounter (Signed)
-----   Message from [name], MD sent at 11/25/2017  7:52 AM EST ----- Labs ok--postmenopausal.

## 2017-11-27 NOTE — Telephone Encounter (Signed)
Pt advised.   Thanks,   -Lavontae Cornia  

## 2017-11-28 ENCOUNTER — Ambulatory Visit (HOSPITAL_COMMUNITY): Payer: BLUE CROSS/BLUE SHIELD | Admitting: Anesthesiology

## 2017-11-28 ENCOUNTER — Encounter (HOSPITAL_COMMUNITY): Payer: Self-pay

## 2017-11-28 ENCOUNTER — Observation Stay (HOSPITAL_COMMUNITY)
Admission: RE | Admit: 2017-11-28 | Discharge: 2017-11-29 | Disposition: A | Discharge status: Home / Self Care | Payer: BLUE CROSS/BLUE SHIELD | Source: Ambulatory Visit | Attending: Orthopedic Surgery | Admitting: Orthopedic Surgery

## 2017-11-28 ENCOUNTER — Observation Stay (HOSPITAL_COMMUNITY): Payer: BLUE CROSS/BLUE SHIELD

## 2017-11-28 ENCOUNTER — Encounter (HOSPITAL_COMMUNITY)
Admission: RE | Disposition: A | Discharge status: Home / Self Care | Payer: Self-pay | Source: Ambulatory Visit | Attending: Orthopedic Surgery

## 2017-11-28 ENCOUNTER — Other Ambulatory Visit: Payer: Self-pay

## 2017-11-28 DIAGNOSIS — Z7901 Long term (current) use of anticoagulants: Secondary | ICD-10-CM | POA: Insufficient documentation

## 2017-11-28 DIAGNOSIS — R262 Difficulty in walking, not elsewhere classified: Secondary | ICD-10-CM | POA: Insufficient documentation

## 2017-11-28 DIAGNOSIS — M1712 Unilateral primary osteoarthritis, left knee: Secondary | ICD-10-CM | POA: Diagnosis not present

## 2017-11-28 DIAGNOSIS — I10 Essential (primary) hypertension: Secondary | ICD-10-CM | POA: Diagnosis not present

## 2017-11-28 DIAGNOSIS — Z96659 Presence of unspecified artificial knee joint: Secondary | ICD-10-CM

## 2017-11-28 DIAGNOSIS — Z79899 Other long term (current) drug therapy: Secondary | ICD-10-CM | POA: Insufficient documentation

## 2017-11-28 DIAGNOSIS — K219 Gastro-esophageal reflux disease without esophagitis: Secondary | ICD-10-CM | POA: Insufficient documentation

## 2017-11-28 DIAGNOSIS — Z87891 Personal history of nicotine dependence: Secondary | ICD-10-CM | POA: Diagnosis not present

## 2017-11-28 DIAGNOSIS — F329 Major depressive disorder, single episode, unspecified: Secondary | ICD-10-CM | POA: Insufficient documentation

## 2017-11-28 DIAGNOSIS — Z96652 Presence of left artificial knee joint: Secondary | ICD-10-CM

## 2017-11-28 HISTORY — PX: PARTIAL KNEE ARTHROPLASTY: SHX2174

## 2017-11-28 SURGERY — ARTHROPLASTY, KNEE, UNICOMPARTMENTAL
Anesthesia: Monitor Anesthesia Care | Site: Knee | Laterality: Left

## 2017-11-28 MED ORDER — OXYCODONE HCL 5 MG PO TABS
ORAL_TABLET | ORAL | Status: AC
  Filled 2017-11-28: qty 1

## 2017-11-28 MED ORDER — CHLORHEXIDINE GLUCONATE 4 % EX LIQD: 60.0000 mL | Freq: Once | CUTANEOUS | Status: DC

## 2017-11-28 MED ORDER — AMPHETAMINE-DEXTROAMPHETAMINE 10 MG PO TABS
20.0000 mg | ORAL_TABLET | Freq: Three times a day (TID) | ORAL | Status: DC
  Administered 2017-11-28 – 2017-11-29 (×4): 20 mg via ORAL
  Filled 2017-11-28 (×4): qty 2

## 2017-11-28 MED ORDER — METOCLOPRAMIDE HCL 5 MG/ML IJ SOLN: 5.0000 mg | Freq: Three times a day (TID) | INTRAMUSCULAR | Status: DC | PRN

## 2017-11-28 MED ORDER — METHOCARBAMOL 500 MG PO TABS
ORAL_TABLET | ORAL | Status: AC
  Filled 2017-11-28: qty 1

## 2017-11-28 MED ORDER — FENTANYL CITRATE (PF) 100 MCG/2ML IJ SOLN: 25.0000 ug | INTRAMUSCULAR | Status: DC | PRN

## 2017-11-28 MED ORDER — KETOROLAC TROMETHAMINE 30 MG/ML IJ SOLN
INTRAMUSCULAR | Status: AC
  Filled 2017-11-28: qty 1

## 2017-11-28 MED ORDER — PROPOFOL 1000 MG/100ML IV EMUL
INTRAVENOUS | Status: AC
  Filled 2017-11-28: qty 300

## 2017-11-28 MED ORDER — BUPIVACAINE-EPINEPHRINE (PF) 0.5% -1:200000 IJ SOLN
INTRAMUSCULAR | Status: DC | PRN
  Administered 2017-11-28: 30 mL via PERINEURAL

## 2017-11-28 MED ORDER — PROPOFOL 500 MG/50ML IV EMUL
INTRAVENOUS | Status: DC | PRN
  Administered 2017-11-28: 35 ug/kg/min via INTRAVENOUS

## 2017-11-28 MED ORDER — BUPIVACAINE HCL (PF) 0.25 % IJ SOLN: INTRAMUSCULAR | Status: DC | PRN

## 2017-11-28 MED ORDER — HYDROMORPHONE HCL 2 MG PO TABS
2.0000 mg | ORAL_TABLET | ORAL | Status: DC | PRN
  Administered 2017-11-28 – 2017-11-29 (×3): 2 mg via ORAL
  Filled 2017-11-28 (×3): qty 1

## 2017-11-28 MED ORDER — KETOROLAC TROMETHAMINE 30 MG/ML IJ SOLN: INTRAMUSCULAR | Status: DC | PRN

## 2017-11-28 MED ORDER — ACETAMINOPHEN 325 MG PO TABS: 325.0000 mg | ORAL_TABLET | Freq: Four times a day (QID) | ORAL | Status: DC | PRN

## 2017-11-28 MED ORDER — DEXAMETHASONE SODIUM PHOSPHATE 10 MG/ML IJ SOLN
10.0000 mg | Freq: Once | INTRAMUSCULAR | Status: AC
  Administered 2017-11-29: 10 mg via INTRAVENOUS
  Filled 2017-11-28: qty 1

## 2017-11-28 MED ORDER — KETOROLAC TROMETHAMINE 15 MG/ML IJ SOLN
15.0000 mg | Freq: Four times a day (QID) | INTRAMUSCULAR | Status: AC
  Administered 2017-11-28 – 2017-11-29 (×3): 15 mg via INTRAVENOUS
  Filled 2017-11-28 (×3): qty 1

## 2017-11-28 MED ORDER — DOCUSATE SODIUM 100 MG PO CAPS
100.0000 mg | ORAL_CAPSULE | Freq: Two times a day (BID) | ORAL | Status: DC
  Administered 2017-11-28 – 2017-11-29 (×2): 100 mg via ORAL
  Filled 2017-11-28 (×2): qty 1

## 2017-11-28 MED ORDER — EPHEDRINE SULFATE 50 MG/ML IJ SOLN
INTRAMUSCULAR | Status: DC | PRN
  Administered 2017-11-28 (×2): 10 mg via INTRAVENOUS

## 2017-11-28 MED ORDER — DIPHENHYDRAMINE HCL 12.5 MG/5ML PO ELIX: 12.5000 mg | ORAL_SOLUTION | ORAL | Status: DC | PRN

## 2017-11-28 MED ORDER — FENTANYL CITRATE (PF) 100 MCG/2ML IJ SOLN
INTRAMUSCULAR | Status: DC | PRN
  Administered 2017-11-28: 25 ug via INTRAVENOUS
  Administered 2017-11-28: 50 ug via INTRAVENOUS
  Administered 2017-11-28: 25 ug via INTRAVENOUS

## 2017-11-28 MED ORDER — BUPIVACAINE IN DEXTROSE 0.75-8.25 % IT SOLN
INTRATHECAL | Status: DC | PRN
  Administered 2017-11-28: 1.8 mL via INTRATHECAL

## 2017-11-28 MED ORDER — MIDAZOLAM HCL 5 MG/5ML IJ SOLN
INTRAMUSCULAR | Status: DC | PRN
  Administered 2017-11-28 (×2): 1 mg via INTRAVENOUS

## 2017-11-28 MED ORDER — FENTANYL CITRATE (PF) 250 MCG/5ML IJ SOLN
INTRAMUSCULAR | Status: AC
  Filled 2017-11-28: qty 5

## 2017-11-28 MED ORDER — PHENYLEPHRINE 40 MCG/ML (10ML) SYRINGE FOR IV PUSH (FOR BLOOD PRESSURE SUPPORT)
PREFILLED_SYRINGE | INTRAVENOUS | Status: DC | PRN
  Administered 2017-11-28 (×2): 80 ug via INTRAVENOUS
  Administered 2017-11-28: 120 ug via INTRAVENOUS
  Administered 2017-11-28: 80 ug via INTRAVENOUS
  Administered 2017-11-28: 120 ug via INTRAVENOUS

## 2017-11-28 MED ORDER — OXYCODONE HCL 10 MG PO TABS: 10.0000 mg | ORAL_TABLET | Freq: Four times a day (QID) | ORAL | 0 refills | Status: DC | PRN

## 2017-11-28 MED ORDER — POTASSIUM CHLORIDE IN NACL 20-0.45 MEQ/L-% IV SOLN
INTRAVENOUS | Status: DC
  Administered 2017-11-28: 17:00:00 via INTRAVENOUS
  Filled 2017-11-28 (×2): qty 1000

## 2017-11-28 MED ORDER — CEFAZOLIN SODIUM-DEXTROSE 2-4 GM/100ML-% IV SOLN
2.0000 g | INTRAVENOUS | Status: AC
  Administered 2017-11-28: 2 g via INTRAVENOUS

## 2017-11-28 MED ORDER — CEFAZOLIN SODIUM-DEXTROSE 2-4 GM/100ML-% IV SOLN
INTRAVENOUS | Status: AC
  Filled 2017-11-28: qty 100

## 2017-11-28 MED ORDER — PHENYLEPHRINE HCL 10 MG/ML IJ SOLN
INTRAMUSCULAR | Status: AC
  Filled 2017-11-28: qty 1

## 2017-11-28 MED ORDER — SENNOSIDES-DOCUSATE SODIUM 8.6-50 MG PO TABS
2.0000 | ORAL_TABLET | Freq: Every day | ORAL | Status: DC
  Administered 2017-11-29: 2 via ORAL
  Filled 2017-11-28: qty 2

## 2017-11-28 MED ORDER — PROMETHAZINE HCL 25 MG/ML IJ SOLN: 6.2500 mg | INTRAMUSCULAR | Status: DC | PRN

## 2017-11-28 MED ORDER — LIDOCAINE HCL (CARDIAC) 20 MG/ML IV SOLN
INTRAVENOUS | Status: AC
  Filled 2017-11-28: qty 5

## 2017-11-28 MED ORDER — INFLUENZA VAC SPLIT QUAD 0.5 ML IM SUSY: 0.5000 mL | PREFILLED_SYRINGE | INTRAMUSCULAR | Status: DC | PRN

## 2017-11-28 MED ORDER — MENTHOL 3 MG MT LOZG: 1.0000 | LOZENGE | OROMUCOSAL | Status: DC | PRN

## 2017-11-28 MED ORDER — FENTANYL CITRATE (PF) 100 MCG/2ML IJ SOLN
INTRAMUSCULAR | Status: AC
  Administered 2017-11-28: 50 ug via INTRAVENOUS
  Filled 2017-11-28: qty 2

## 2017-11-28 MED ORDER — BUPIVACAINE HCL (PF) 0.25 % IJ SOLN
INTRAMUSCULAR | Status: AC
  Filled 2017-11-28: qty 30

## 2017-11-28 MED ORDER — ALBUMIN HUMAN 5 % IV SOLN
INTRAVENOUS | Status: DC | PRN
  Administered 2017-11-28: 12:00:00 via INTRAVENOUS

## 2017-11-28 MED ORDER — KETOROLAC TROMETHAMINE 15 MG/ML IJ SOLN
INTRAMUSCULAR | Status: AC
  Filled 2017-11-28: qty 1

## 2017-11-28 MED ORDER — FENTANYL CITRATE (PF) 100 MCG/2ML IJ SOLN
50.0000 ug | Freq: Once | INTRAMUSCULAR | Status: AC
  Administered 2017-11-28: 50 ug via INTRAVENOUS

## 2017-11-28 MED ORDER — CYCLOBENZAPRINE HCL 10 MG PO TABS: 20.0000 mg | ORAL_TABLET | Freq: Every day | ORAL | 0 refills | Status: DC

## 2017-11-28 MED ORDER — OXYCODONE HCL 5 MG PO TABS
5.0000 mg | ORAL_TABLET | ORAL | Status: DC | PRN
  Administered 2017-11-28: 10 mg via ORAL
  Filled 2017-11-28 (×2): qty 2

## 2017-11-28 MED ORDER — MIDAZOLAM HCL 2 MG/2ML IJ SOLN
INTRAMUSCULAR | Status: AC
  Filled 2017-11-28: qty 2

## 2017-11-28 MED ORDER — SENNA-DOCUSATE SODIUM 8.6-50 MG PO TABS: 2.0000 | ORAL_TABLET | Freq: Every day | ORAL | Status: DC

## 2017-11-28 MED ORDER — PHENOL 1.4 % MT LIQD: 1.0000 | OROMUCOSAL | Status: DC | PRN

## 2017-11-28 MED ORDER — BISACODYL 10 MG RE SUPP: 10.0000 mg | Freq: Every day | RECTAL | Status: DC | PRN

## 2017-11-28 MED ORDER — ACETAMINOPHEN 500 MG PO TABS
1000.0000 mg | ORAL_TABLET | Freq: Four times a day (QID) | ORAL | Status: AC
  Administered 2017-11-28 – 2017-11-29 (×4): 1000 mg via ORAL
  Filled 2017-11-28 (×4): qty 2

## 2017-11-28 MED ORDER — KETOROLAC TROMETHAMINE 15 MG/ML IJ SOLN
15.0000 mg | Freq: Four times a day (QID) | INTRAMUSCULAR | Status: DC
  Administered 2017-11-28: 15 mg via INTRAVENOUS

## 2017-11-28 MED ORDER — PROPOFOL 500 MG/50ML IV EMUL
INTRAVENOUS | Status: AC
  Filled 2017-11-28: qty 150

## 2017-11-28 MED ORDER — GABAPENTIN 600 MG PO TABS
300.0000 mg | ORAL_TABLET | ORAL | Status: DC | PRN
  Administered 2017-11-28: 600 mg via ORAL
  Filled 2017-11-28: qty 1

## 2017-11-28 MED ORDER — ONDANSETRON HCL 4 MG/2ML IJ SOLN: 4.0000 mg | Freq: Four times a day (QID) | INTRAMUSCULAR | Status: DC | PRN

## 2017-11-28 MED ORDER — HYDROMORPHONE HCL 2 MG PO TABS: 2.0000 mg | ORAL_TABLET | ORAL | 0 refills | Status: DC | PRN

## 2017-11-28 MED ORDER — ALUM & MAG HYDROXIDE-SIMETH 200-200-20 MG/5ML PO SUSP: 30.0000 mL | ORAL | Status: DC | PRN

## 2017-11-28 MED ORDER — METHOCARBAMOL 1000 MG/10ML IJ SOLN
500.0000 mg | Freq: Four times a day (QID) | INTRAVENOUS | Status: DC | PRN
  Filled 2017-11-28: qty 5

## 2017-11-28 MED ORDER — MAGNESIUM CITRATE PO SOLN: 1.0000 | Freq: Once | ORAL | Status: DC | PRN

## 2017-11-28 MED ORDER — RIVAROXABAN 10 MG PO TABS: 10.0000 mg | ORAL_TABLET | Freq: Every day | ORAL | 0 refills | Status: DC

## 2017-11-28 MED ORDER — POLYETHYLENE GLYCOL 3350 17 G PO PACK: 17.0000 g | PACK | Freq: Every day | ORAL | Status: DC | PRN

## 2017-11-28 MED ORDER — MIDAZOLAM HCL 2 MG/2ML IJ SOLN
INTRAMUSCULAR | Status: AC
  Administered 2017-11-28: 2 mg via INTRAVENOUS
  Filled 2017-11-28: qty 2

## 2017-11-28 MED ORDER — EPHEDRINE 5 MG/ML INJ
INTRAVENOUS | Status: AC
  Filled 2017-11-28: qty 10

## 2017-11-28 MED ORDER — FAMOTIDINE 20 MG PO TABS: 20.0000 mg | ORAL_TABLET | Freq: Two times a day (BID) | ORAL | Status: DC | PRN

## 2017-11-28 MED ORDER — SODIUM CHLORIDE 0.9 % IR SOLN
Status: DC | PRN
  Administered 2017-11-28: 1000 mL

## 2017-11-28 MED ORDER — OXYCODONE HCL 5 MG PO TABS
5.0000 mg | ORAL_TABLET | Freq: Once | ORAL | Status: AC | PRN
  Administered 2017-11-28: 5 mg via ORAL

## 2017-11-28 MED ORDER — MIDAZOLAM HCL 2 MG/2ML IJ SOLN
2.0000 mg | Freq: Once | INTRAMUSCULAR | Status: AC
  Administered 2017-11-28: 2 mg via INTRAVENOUS

## 2017-11-28 MED ORDER — METHOCARBAMOL 500 MG PO TABS
500.0000 mg | ORAL_TABLET | Freq: Four times a day (QID) | ORAL | Status: DC | PRN
  Administered 2017-11-28 – 2017-11-29 (×2): 500 mg via ORAL
  Filled 2017-11-28: qty 1

## 2017-11-28 MED ORDER — ZOLPIDEM TARTRATE 5 MG PO TABS
5.0000 mg | ORAL_TABLET | Freq: Every evening | ORAL | Status: DC | PRN
Start: 2017-11-28 — End: 2017-11-29

## 2017-11-28 MED ORDER — HYDROMORPHONE HCL 1 MG/ML IJ SOLN: 0.5000 mg | INTRAMUSCULAR | Status: DC | PRN

## 2017-11-28 MED ORDER — ONDANSETRON HCL 4 MG/2ML IJ SOLN
INTRAMUSCULAR | Status: DC | PRN
  Administered 2017-11-28: 4 mg via INTRAVENOUS

## 2017-11-28 MED ORDER — LIDOCAINE HCL (CARDIAC) 20 MG/ML IV SOLN
INTRAVENOUS | Status: DC | PRN
  Administered 2017-11-28 (×2): 30 mg via INTRATRACHEAL

## 2017-11-28 MED ORDER — ONDANSETRON HCL 4 MG/2ML IJ SOLN
INTRAMUSCULAR | Status: AC
  Filled 2017-11-28: qty 2

## 2017-11-28 MED ORDER — AMLODIPINE BESYLATE 5 MG PO TABS
5.0000 mg | ORAL_TABLET | Freq: Every day | ORAL | Status: DC
  Administered 2017-11-29: 5 mg via ORAL
  Filled 2017-11-28: qty 1

## 2017-11-28 MED ORDER — OXYCODONE HCL 5 MG/5ML PO SOLN: 5.0000 mg | Freq: Once | ORAL | Status: AC | PRN

## 2017-11-28 MED ORDER — RIVAROXABAN 10 MG PO TABS
10.0000 mg | ORAL_TABLET | Freq: Every day | ORAL | Status: DC
  Administered 2017-11-29: 10 mg via ORAL
  Filled 2017-11-28: qty 1

## 2017-11-28 MED ORDER — METOCLOPRAMIDE HCL 5 MG PO TABS: 5.0000 mg | ORAL_TABLET | Freq: Three times a day (TID) | ORAL | Status: DC | PRN

## 2017-11-28 MED ORDER — PHENYLEPHRINE HCL 10 MG/ML IJ SOLN
INTRAVENOUS | Status: DC | PRN
  Administered 2017-11-28: 50 ug/min via INTRAVENOUS

## 2017-11-28 MED ORDER — OXYCODONE HCL 5 MG PO TABS
10.0000 mg | ORAL_TABLET | ORAL | Status: DC | PRN
  Administered 2017-11-29 (×2): 15 mg via ORAL
  Filled 2017-11-28 (×2): qty 3

## 2017-11-28 MED ORDER — DULOXETINE HCL 60 MG PO CPEP
60.0000 mg | ORAL_CAPSULE | Freq: Two times a day (BID) | ORAL | Status: DC
  Administered 2017-11-28 – 2017-11-29 (×2): 60 mg via ORAL
  Filled 2017-11-28 (×2): qty 1

## 2017-11-28 MED ORDER — 0.9 % SODIUM CHLORIDE (POUR BTL) OPTIME
TOPICAL | Status: DC | PRN
  Administered 2017-11-28: 1000 mL

## 2017-11-28 MED ORDER — SENNA-DOCUSATE SODIUM 8.6-50 MG PO TABS: 2.0000 | ORAL_TABLET | Freq: Every day | ORAL | 1 refills | Status: DC

## 2017-11-28 MED ORDER — LACTATED RINGERS IV SOLN
INTRAVENOUS | Status: DC
  Administered 2017-11-28 (×2): via INTRAVENOUS

## 2017-11-28 MED ORDER — CEFAZOLIN SODIUM-DEXTROSE 2-4 GM/100ML-% IV SOLN
2.0000 g | Freq: Four times a day (QID) | INTRAVENOUS | Status: AC
  Administered 2017-11-28 – 2017-11-29 (×2): 2 g via INTRAVENOUS
  Filled 2017-11-28 (×2): qty 100

## 2017-11-28 MED ORDER — ONDANSETRON HCL 4 MG PO TABS: 4.0000 mg | ORAL_TABLET | Freq: Four times a day (QID) | ORAL | Status: DC | PRN

## 2017-11-28 MED ORDER — PHENYLEPHRINE 40 MCG/ML (10ML) SYRINGE FOR IV PUSH (FOR BLOOD PRESSURE SUPPORT)
PREFILLED_SYRINGE | INTRAVENOUS | Status: AC
  Filled 2017-11-28: qty 10

## 2017-11-28 SURGICAL SUPPLY — 59 items
BANDAGE ESMARK 6X9 LF (GAUZE/BANDAGES/DRESSINGS) ×1 IMPLANT
BEARING TIBIAL SZ 3 MED 3 (Knees) ×1 IMPLANT
BNDG CMPR 9X6 STRL LF SNTH (GAUZE/BANDAGES/DRESSINGS) ×1
BNDG CMPR MED 10X6 ELC LF (GAUZE/BANDAGES/DRESSINGS) ×1
BNDG CMPR MED 15X6 ELC VLCR LF (GAUZE/BANDAGES/DRESSINGS) ×1
BNDG ELASTIC 6X10 VLCR STRL LF (GAUZE/BANDAGES/DRESSINGS) ×1 IMPLANT
BNDG ELASTIC 6X15 VLCR STRL LF (GAUZE/BANDAGES/DRESSINGS) ×2 IMPLANT
BNDG ESMARK 6X9 LF (GAUZE/BANDAGES/DRESSINGS) ×2
BOWL SMART MIX CTS (DISPOSABLE) ×2 IMPLANT
BRNG TIB MED 3 PHS 3 LT MEN (Knees) ×1 IMPLANT
CEMENT BONE R 1X40 (Cement) ×1 IMPLANT
CLSR STERI-STRIP ANTIMIC 1/2X4 (GAUZE/BANDAGES/DRESSINGS) ×2 IMPLANT
COMPONENT TIB MDL OXFRD LT SZA (Joint) IMPLANT
COVER SURGICAL LIGHT HANDLE (MISCELLANEOUS) ×2 IMPLANT
CUFF TOURNIQUET SINGLE 34IN LL (TOURNIQUET CUFF) ×2 IMPLANT
DRAPE EXTREMITY T 121X128X90 (DRAPE) IMPLANT
DRAPE HALF SHEET 40X57 (DRAPES) ×1 IMPLANT
DRAPE U-SHAPE 47X51 STRL (DRAPES) ×2 IMPLANT
DRSG MEPILEX BORDER 4X8 (GAUZE/BANDAGES/DRESSINGS) ×2 IMPLANT
DURAPREP 26ML APPLICATOR (WOUND CARE) ×3 IMPLANT
ELECT CAUTERY BLADE 6.4 (BLADE) ×2 IMPLANT
ELECT REM PT RETURN 9FT ADLT (ELECTROSURGICAL) ×2
ELECTRODE REM PT RTRN 9FT ADLT (ELECTROSURGICAL) ×1 IMPLANT
GLOVE BIOGEL PI IND STRL 6.5 (GLOVE) IMPLANT
GLOVE BIOGEL PI INDICATOR 6.5 (GLOVE) ×1
GLOVE BIOGEL PI ORTHO PRO SZ8 (GLOVE) ×2
GLOVE ORTHO TXT STRL SZ7.5 (GLOVE) ×2 IMPLANT
GLOVE PI ORTHO PRO STRL SZ8 (GLOVE) ×2 IMPLANT
GLOVE SURG ORTHO 8.0 STRL STRW (GLOVE) ×2 IMPLANT
GLOVE SURG SS PI 6.5 STRL IVOR (GLOVE) ×1 IMPLANT
GLOVE SURG SS PI 7.0 STRL IVOR (GLOVE) ×1 IMPLANT
GOWN STRL REUS W/ TWL XL LVL3 (GOWN DISPOSABLE) ×1 IMPLANT
GOWN STRL REUS W/TWL 2XL LVL3 (GOWN DISPOSABLE) ×2 IMPLANT
GOWN STRL REUS W/TWL XL LVL3 (GOWN DISPOSABLE) ×2
HANDPIECE INTERPULSE COAX TIP (DISPOSABLE) ×2
HOOD PEEL AWAY FACE SHEILD DIS (HOOD) ×2 IMPLANT
HOOD PEEL AWAY FLYTE STAYCOOL (MISCELLANEOUS) ×2 IMPLANT
IMMOBILIZER KNEE 22 UNIV (SOFTGOODS) ×2 IMPLANT
KIT BASIN OR (CUSTOM PROCEDURE TRAY) ×2 IMPLANT
KIT ROOM TURNOVER OR (KITS) ×2 IMPLANT
MANIFOLD NEPTUNE II (INSTRUMENTS) ×2 IMPLANT
NDL HYPO 21X1.5 SAFETY (NEEDLE) IMPLANT
NEEDLE HYPO 21X1.5 SAFETY (NEEDLE) IMPLANT
NS IRRIG 1000ML POUR BTL (IV SOLUTION) ×2 IMPLANT
PACK BLADE SAW RECIP 70 3 PT (BLADE) ×1 IMPLANT
PACK TOTAL JOINT (CUSTOM PROCEDURE TRAY) ×2 IMPLANT
PAD ARMBOARD 7.5X6 YLW CONV (MISCELLANEOUS) ×4 IMPLANT
PEG TWIN FEM CEMENTED MED (Knees) ×1 IMPLANT
SET HNDPC FAN SPRY TIP SCT (DISPOSABLE) ×1 IMPLANT
STRIP CLOSURE SKIN 1/2X4 (GAUZE/BANDAGES/DRESSINGS) ×2 IMPLANT
SUCTION FRAZIER HANDLE 10FR (MISCELLANEOUS) ×1
SUCTION TUBE FRAZIER 10FR DISP (MISCELLANEOUS) ×1 IMPLANT
SUT VIC AB 0 CT1 27 (SUTURE)
SUT VIC AB 0 CT1 27XBRD ANBCTR (SUTURE) ×1 IMPLANT
SUT VIC AB 1 CT1 27 (SUTURE)
SUT VIC AB 1 CT1 27XBRD ANBCTR (SUTURE) ×1 IMPLANT
SUT VIC AB 3-0 SH 8-18 (SUTURE) ×2 IMPLANT
TIBIA MEDIAL OXFORD LEFT SZ A (Joint) ×2 IMPLANT
TOWEL OR 17X26 10 PK STRL BLUE (TOWEL DISPOSABLE) ×2 IMPLANT

## 2017-11-28 NOTE — Discharge Instructions (Signed)
INSTRUCTIONS AFTER JOINT REPLACEMENT  ° °o Remove items at home which could result in a fall. This includes throw rugs or furniture in walking pathways °o ICE to the affected joint every three hours while awake for 30 minutes at a time, for at least the first 3-5 days, and then as needed for pain and swelling.  Continue to use ice for pain and swelling. You may notice swelling that will progress down to the foot and ankle.  This is normal after surgery.  Elevate your leg when you are not up walking on it.   °o Continue to use the breathing machine you got in the hospital (incentive spirometer) which will help keep your temperature down.  It is common for your temperature to cycle up and down following surgery, especially at night when you are not up moving around and exerting yourself.  The breathing machine keeps your lungs expanded and your temperature down. ° ° °DIET:  As you were doing prior to hospitalization, we recommend a well-balanced diet. ° °DRESSING / WOUND CARE / SHOWERING ° °You may change your dressing 3-5 days after surgery.  Then change the dressing every day with sterile gauze.  Please use good hand washing techniques before changing the dressing.  Do not use any lotions or creams on the incision until instructed by your surgeon. ° °ACTIVITY ° °o Increase activity slowly as tolerated, but follow the weight bearing instructions below.   °o No driving for 6 weeks or until further direction given by your physician.  You cannot drive while taking narcotics.  °o No lifting or carrying greater than 10 lbs. until further directed by your surgeon. °o Avoid periods of inactivity such as sitting longer than an hour when not asleep. This helps prevent blood clots.  °o You may return to work once you are authorized by your doctor.  ° ° ° °WEIGHT BEARING  ° °Weight bearing as tolerated with assist device (walker, cane, etc) as directed, use it as long as suggested by your surgeon or therapist, typically at  least 4-6 weeks. ° ° °EXERCISES ° °Results after joint replacement surgery are often greatly improved when you follow the exercise, range of motion and muscle strengthening exercises prescribed by your doctor. Safety measures are also important to protect the joint from further injury. Any time any of these exercises cause you to have increased pain or swelling, decrease what you are doing until you are comfortable again and then slowly increase them. If you have problems or questions, call your caregiver or physical therapist for advice.  ° °Rehabilitation is important following a joint replacement. After just a few days of immobilization, the muscles of the leg can become weakened and shrink (atrophy).  These exercises are designed to build up the tone and strength of the thigh and leg muscles and to improve motion. Often times heat used for twenty to thirty minutes before working out will loosen up your tissues and help with improving the range of motion but do not use heat for the first two weeks following surgery (sometimes heat can increase post-operative swelling).  ° °These exercises can be done on a training (exercise) mat, on the floor, on a table or on a bed. Use whatever works the best and is most comfortable for you.    Use music or television while you are exercising so that the exercises are a pleasant break in your day. This will make your life better with the exercises acting as a break   in your routine that you can look forward to.   Perform all exercises about fifteen times, three times per day or as directed.  You should exercise both the operative leg and the other leg as well. ° °Exercises include: °  °• Quad Sets - Tighten up the muscle on the front of the thigh (Quad) and hold for 5-10 seconds.   °• Straight Leg Raises - With your knee straight (if you were given a brace, keep it on), lift the leg to 60 degrees, hold for 3 seconds, and slowly lower the leg.  Perform this exercise against  resistance later as your leg gets stronger.  °• Leg Slides: Lying on your back, slowly slide your foot toward your buttocks, bending your knee up off the floor (only go as far as is comfortable). Then slowly slide your foot back down until your leg is flat on the floor again.  °• Angel Wings: Lying on your back spread your legs to the side as far apart as you can without causing discomfort.  °• Hamstring Strength:  Lying on your back, push your heel against the floor with your leg straight by tightening up the muscles of your buttocks.  Repeat, but this time bend your knee to a comfortable angle, and push your heel against the floor.  You may put a pillow under the heel to make it more comfortable if necessary.  ° °A rehabilitation program following joint replacement surgery can speed recovery and prevent re-injury in the future due to weakened muscles. Contact your doctor or a physical therapist for more information on knee rehabilitation.  ° ° °CONSTIPATION ° °Constipation is defined medically as fewer than three stools per week and severe constipation as less than one stool per week.  Even if you have a regular bowel pattern at home, your normal regimen is likely to be disrupted due to multiple reasons following surgery.  Combination of anesthesia, postoperative narcotics, change in appetite and fluid intake all can affect your bowels.  ° °YOU MUST use at least one of the following options; they are listed in order of increasing strength to get the job done.  They are all available over the counter, and you may need to use some, POSSIBLY even all of these options:   ° °Drink plenty of fluids (prune juice may be helpful) and high fiber foods °Colace 100 mg by mouth twice a day  °Senokot for constipation as directed and as needed Dulcolax (bisacodyl), take with full glass of water  °Miralax (polyethylene glycol) once or twice a day as needed. ° °If you have tried all these things and are unable to have a bowel  movement in the first 3-4 days after surgery call either your surgeon or your primary doctor.   ° °If you experience loose stools or diarrhea, hold the medications until you stool forms back up.  If your symptoms do not get better within 1 week or if they get worse, check with your doctor.  If you experience "the worst abdominal pain ever" or develop nausea or vomiting, please contact the office immediately for further recommendations for treatment. ° ° °ITCHING:  If you experience itching with your medications, try taking only a single pain pill, or even half a pain pill at a time.  You can also use Benadryl over the counter for itching or also to help with sleep.  ° °TED HOSE STOCKINGS:  Use stockings on both legs until for at least 2 weeks or as   directed by physician office. They may be removed at night for sleeping.  MEDICATIONS:  See your medication summary on the After Visit Summary that nursing will review with you.  You may have some home medications which will be placed on hold until you complete the course of blood thinner medication.  It is important for you to complete the blood thinner medication as prescribed.  PRECAUTIONS:  If you experience chest pain or shortness of breath - call 911 immediately for transfer to the hospital emergency department.   If you develop a fever greater that 101 F, purulent drainage from wound, increased redness or drainage from wound, foul odor from the wound/dressing, or calf pain - CONTACT YOUR SURGEON.                                                   FOLLOW-UP APPOINTMENTS:  If you do not already have a post-op appointment, please call the office for an appointment to be seen by your surgeon.  Guidelines for how soon to be seen are listed in your After Visit Summary, but are typically between 1-4 weeks after surgery.   MAKE SURE YOU:   Understand these instructions.   Get help right away if you are not doing well or get worse.    Thank you for  letting us be a part of your medical care team.  It is a privilege we respect greatly.  We hope these instructions will help you stay on track for a fast and full recovery!   Information on my medicine - XARELTO (Rivaroxaban)  This medication education was reviewed with me or my healthcare representative as part of my discharge preparation.  Why was Xarelto prescribed for you? Xarelto was prescribed for you to reduce the risk of blood clots forming after orthopedic surgery. The medical term for these abnormal blood clots is venous thromboembolism (VTE).  What do you need to know about xarelto ? Take your Xarelto ONCE DAILY at the same time every day. You may take it either with or without food.  If you have difficulty swallowing the tablet whole, you may crush it and mix in applesauce just prior to taking your dose.  Take Xarelto exactly as prescribed by your doctor and DO NOT stop taking Xarelto without talking to the doctor who prescribed the medication.  Stopping without other VTE prevention medication to take the place of Xarelto may increase your risk of developing a clot.  After discharge, you should have regular check-up appointments with your healthcare provider that is prescribing your Xarelto.    What do you do if you miss a dose? If you miss a dose, take it as soon as you remember on the same day then continue your regularly scheduled once daily regimen the next day. Do not take two doses of Xarelto on the same day.   Important Safety Information A possible side effect of Xarelto is bleeding. You should call your healthcare provider right away if you experience any of the following: ? Bleeding from an injury or your nose that does not stop. ? Unusual colored urine (red or dark brown) or unusual colored stools (red or black). ? Unusual bruising for unknown reasons. ? A serious fall or if you hit your head (even if there is no bleeding).  Some medicines may interact  with Xarelto  and might increase your risk of bleeding while on Xarelto. To help avoid this, consult your healthcare provider or pharmacist prior to using any new prescription or non-prescription medications, including herbals, vitamins, non-steroidal anti-inflammatory drugs (NSAIDs) and supplements.  This website has more information on Xarelto: [email or web address]

## 2017-11-28 NOTE — Op Note (Signed)
11/28/2017  12:44 PM  PATIENT:  Alicia Rice    PRE-OPERATIVE DIAGNOSIS: Left knee primary localized osteoarthritis  POST-OPERATIVE DIAGNOSIS:  Same  PROCEDURE: Left unicompartmental Knee Arthroplasty  SURGEON:  [name], MD  PHYSICIAN ASSISTANT: Joya Gaskins, OPA-C, present and scrubbed throughout the case, critical for completion in a timely fashion, and for retraction, instrumentation, and closure.  ANESTHESIA:   Spinal with regional block  ESTIMATED BLOOD LOSS: 75 mL  UNIQUE ASPECTS OF THE CASE: There was substantial destruction of the medial femoral condyle with a cheesy type appearance to the bone.  The tibia had a fair amount of internal torsion as well, which slightly altered the tibiofemoral mechanics, but once the implants were in the tibia seemed to track well.  The lateral side was pristine, the patellofemoral joint was well preserved with the exception of a little bit of medial changes on the medial facet, but the lateral side was totally intact.  The femur was between a small and a medium, however there was a fair amount of chondral loss as well as some bone loss, so I went with the medium to rebuild the appropriate arc.  PREOPERATIVE INDICATIONS:  Alicia Rice is a  57 y.o. female with a diagnosis of djd left knee who failed conservative measures and elected for surgical management.    The risks benefits and alternatives were discussed with the patient preoperatively including but not limited to the risks of infection, bleeding, nerve injury, cardiopulmonary complications, blood clots, the need for revision surgery, among others, and the patient was willing to proceed.  OPERATIVE IMPLANTS: Biomet [employer] mobile bearing medial compartment arthroplasty femur size medium, tibia size A, bearing size 3.  OPERATIVE FINDINGS: Endstage grade 4 medial compartment osteoarthritis. No significant changes in the lateral or patellofemoral joint.  The ACL was  intact.  OPERATIVE PROCEDURE: The patient was brought to the operating room placed in the supine position.  Spinal anesthesia was administered. IV antibiotics were given. The lower extremity was placed in the legholder and prepped and draped in usual sterile fashion.  Time out was performed.  The leg was elevated and exsanguinated and the tourniquet was inflated. Anteromedial incision was performed, and I took care to preserve the MCL. Parapatellar incision was carried out, and the osteophytes were excised, along with the medial meniscus and a small portion of the fat pad.  The extra medullary tibial cutting jig was applied, using the spoon and the 2mm G-Clamp and the 2 mm shim, and I took care to protect the anterior cruciate ligament insertion and the tibial spine. The medial collateral ligament was also protected, and I resected my proximal tibia, matching the anatomic slope.   The proximal tibial bony cut was removed in one piece, and I turned my attention to the femur.  The intramedullary femoral rod was placed using the drill, and then using the appropriate reference, I assembled the femoral jig, setting my posterior cutting block. I resected my posterior femur, used the 0 spigot for the anterior femur, and then measured my gap.   I then used the appropriate mill to match the extension gap to the flexion gap. The second milling was at a 2 and then a 3.  The gaps were then measured again with the appropriate feeler gauges. Once I had balanced flexion and extension gaps, I then completed the preparation of the femur.  I milled off the anterior aspect of the distal femur to prevent impingement. I also exposed the tibia,  and selected the above-named component, and then used the cutting jig to prepare the keel slot on the tibia. I also used the awl to curette out the bone to complete the preparation of the keel. The back wall was intact.  I then placed trial components, and it was found to have  excellent motion, and appropriate balance.  I then cemented the components into place, cementing the tibia first, removing all excess cement, and then cementing the femur.  All loose cement was removed.  The real polyethylene insert was applied manually, and the knee was taken through functional range of motion, and found to have excellent stability and restoration of joint motion, with excellent balance.  The wounds were irrigated copiously, and the parapatellar tissue closed with Vicryl, followed by Vicryl for the subcutaneous tissue, with routine closure with Steri-Strips and sterile gauze.  The tourniquet was released, and the patient was awakened and extubated and returned to PACU in stable and satisfactory condition. There were no complications.

## 2017-11-28 NOTE — Anesthesia Preprocedure Evaluation (Addendum)
Anesthesia Evaluation  Patient identified by MRN, date of birth, ID band Patient awake    Reviewed: Allergy & Precautions, NPO status , Patient's Chart, lab work & pertinent test results  History of Anesthesia Complications (+) PONV and history of anesthetic complications  Airway Mallampati: II  TM Distance: >3 FB Neck ROM: Full    Dental  (+) Poor Dentition, Loose, Dental Advisory Given, Missing,    Pulmonary former smoker,    breath sounds clear to auscultation       Cardiovascular hypertension, Pt. on medications  Rhythm:Regular Rate:Normal     Neuro/Psych  Headaches, PSYCHIATRIC DISORDERS Anxiety Depression Seizure x 1 2007 with no determined etiology, not on meds    GI/Hepatic GERD  Medicated and Controlled,  Endo/Other  negative endocrine ROS  Renal/GU negative Renal ROS     Musculoskeletal  (+) Arthritis , Chronic back pain   Abdominal   Peds  (+) ATTENTION DEFICIT DISORDER WITHOUT HYPERACTIVITY Hematology negative hematology ROS (+)   Anesthesia Other Findings   Reproductive/Obstetrics                            Anesthesia Physical  Anesthesia Plan  ASA: II  Anesthesia Plan: Spinal   Post-op Pain Management:  Regional for Post-op pain   Induction:   PONV Risk Score and Plan: 3 and Treatment may vary due to age or medical condition and Propofol infusion  Airway Management Planned: Natural Airway and Simple Face Mask  Additional Equipment: None  Intra-op Plan:   Post-operative Plan:   Informed Consent: I have reviewed the patients History and Physical, chart, labs and discussed the procedure including the risks, benefits and alternatives for the proposed anesthesia with the patient or authorized representative who has indicated his/her understanding and acceptance.   Dental advisory given  Plan Discussed with: CRNA  Anesthesia Plan Comments:          Anesthesia Quick Evaluation

## 2017-11-28 NOTE — Anesthesia Postprocedure Evaluation (Signed)
Anesthesia Post Note  Patient: Alicia Rice  Procedure(s) Performed: UNICOMPARTMENTAL KNEE (Left Knee)     Patient location during evaluation: PACU Anesthesia Type: Spinal Level of consciousness: awake and alert Pain management: pain level controlled Vital Signs Assessment: post-procedure vital signs reviewed and stable Respiratory status: spontaneous breathing and respiratory function stable Cardiovascular status: blood pressure returned to baseline and stable Postop Assessment: spinal receding and no apparent nausea or vomiting Anesthetic complications: no    Last Vitals:  Vitals:   11/28/17 1410 11/28/17 1415  BP: (!) 101/55   Pulse:  69  Resp:  13  Temp:    SpO2:  100%    Last Pain:  Vitals:   11/28/17 1415  TempSrc:   PainSc: [name]

## 2017-11-28 NOTE — Anesthesia Procedure Notes (Addendum)
Anesthesia Regional Block: Adductor canal block   Pre-Anesthetic Checklist: ,, timeout performed, Correct Patient, Correct Site, Correct Laterality, Correct Procedure, Correct Position, site marked, Risks and benefits discussed,  Surgical consent,  Pre-op evaluation,  At surgeon's request and post-op pain management  Laterality: Left  Prep: chloraprep       Needles:  Injection technique: Single-shot  Needle Type: Echogenic Needle     Needle Length: 10cm  Needle Gauge: 21     Additional Needles:   Narrative:  Start time: 11/28/2017 9:55 AM End time: 11/28/2017 9:58 AM Injection made incrementally with aspirations every 5 mL.  Performed by: Personally  Anesthesiologist: Audry Pili, MD  Additional Notes: No pain on injection. No increased resistance to injection. Injection made in 5cc increments. Good needle visualization. Patient tolerated the procedure well.

## 2017-11-28 NOTE — Transfer of Care (Signed)
Immediate Anesthesia Transfer of Care Note  Patient: Alicia Rice  Procedure(s) Performed: UNICOMPARTMENTAL KNEE (Left Knee)  Patient Location: PACU  Anesthesia Type:MAC, Regional and Spinal  Level of Consciousness: awake, alert , oriented and sedated  Airway & Oxygen Therapy: Patient Spontanous Breathing and Patient connected to nasal cannula oxygen  Post-op Assessment: Report given to RN, Post -op Vital signs reviewed and stable and Patient moving all extremities  Post vital signs: Reviewed and stable  Last Vitals:  Vitals:   11/28/17 1010 11/28/17 1020  BP: (!) 89/49 (!) 106/52  Pulse: 80 83  Resp: 16 17  Temp:    SpO2: 100% 99%    Last Pain:  Vitals:   11/28/17 1005  TempSrc:   PainSc: 0-No pain      Patients Stated Pain Goal: 3 (29/47/65 [id number])  Complications: No apparent anesthesia complications

## 2017-11-28 NOTE — Evaluation (Signed)
Physical Therapy Evaluation Patient Details Name: Alicia Rice MRN: [id number] DOB: April 19, 1961 Today's Date: 11/28/2017   History of Present Illness  57 y.o. female admitted on 11/28/17 for elective L unicompartmental TKA.  Pt with significant PMH of HTN, L distal radius fx s/p ORIF, chronic back pain, R calcaneal fx with multiple surgeries, elbow surgery, and mandible fx surgery.  Clinical Impression  Pt is POD #0 and is moving well, one person assist with RW down the hallway.  She will likely progress well enough to d/c home with her family's supervision.   PT to follow acutely for deficits listed below.     Follow Up Recommendations Follow surgeon's recommendation for DC plan and follow-up therapies;Supervision for mobility/OOB    Equipment Recommendations  None recommended by PT    Recommendations for Other Services   NA     Precautions / Restrictions Precautions Precautions: Knee Precaution Booklet Issued: Yes (comment) Precaution Comments: knee exercise handout given and knee precautions reviewed.  Required Braces or Orthoses: Knee Immobilizer - Left(used POD #0) Restrictions Weight Bearing Restrictions: Yes LLE Weight Bearing: Weight bearing as tolerated      Mobility  Bed Mobility Overal bed mobility: Needs Assistance Bed Mobility: Supine to Sit     Supine to sit: Min guard     General bed mobility comments: Min guard for safety and to help progress left leg to EOB (although she did not need it)  Transfers Overall transfer level: Needs assistance Equipment used: Rolling walker (2 wheeled) Transfers: Sit to/from Stand Sit to Stand: Min assist         General transfer comment: Min assist to help for balance and RW stability during transitions.   Ambulation/Gait Ambulation/Gait assistance: Min guard Ambulation Distance (Feet): 75 Feet Assistive device: Rolling walker (2 wheeled) Gait Pattern/deviations: Step-to pattern;Antalgic Gait velocity:  decreased   General Gait Details: Pt with moderately antalgic gait pattern, verbal cues for LE sequencing.       Balance Overall balance assessment: Needs assistance Sitting-balance support: Feet supported;Bilateral upper extremity supported Sitting balance-Leahy Scale: Good     Standing balance support: Bilateral upper extremity supported Standing balance-Leahy Scale: Fair                               Pertinent Vitals/Pain Pain Assessment: Faces Faces Pain Scale: Hurts little more Pain Location: with ROM left knee Pain Descriptors / Indicators: Grimacing;Guarding Pain Intervention(s): Limited activity within patient's tolerance;Monitored during session;Repositioned    Home Living Family/patient expects to be discharged to:: Private residence Living Arrangements: Spouse/significant other;Children Available Help at Discharge: Family(daughter for 24 hours at first, husband until next week. ) Type of Home: House Home Access: Stairs to enter Entrance Stairs-Rails: None Entrance Stairs-Number of Steps: 3(front steps 5 with bil hand rails (can't reach both)) Home Layout: Two level;Bed/bath upstairs;Able to live on main level with bedroom/bathroom Home Equipment: Gilford Rile - 2 wheels;Cane - single point;Bedside commode;Wheelchair - manual;Crutches      Prior Function Level of Independence: Independent with assistive device(s)         Comments: uses cane, falls frequently     Hand Dominance   Dominant Hand: Right    Extremity/Trunk Assessment   Upper Extremity Assessment Upper Extremity Assessment: Overall WFL for tasks assessed    Lower Extremity Assessment Lower Extremity Assessment: RLE deficits/detail;LLE deficits/detail RLE Deficits / Details: right ankle with limited mobility due to h/o calcaneal fx and surgeries, chronic edema and  discoloration.  LLE Deficits / Details: left knee with normal post op pain and weakness, ankle 3/5, knee 2+/5, hip  flexion 3-/5 LLE Sensation: WNL    Cervical / Trunk Assessment Cervical / Trunk Assessment: Other exceptions Cervical / Trunk Exceptions: chart reports h/o chronic low back pain  Communication   Communication: No difficulties  Cognition Arousal/Alertness: Awake/alert Behavior During Therapy: WFL for tasks assessed/performed Overall Cognitive Status: Within Functional Limits for tasks assessed                                           Exercises Total Joint Exercises Ankle Circles/Pumps: AROM;Both;10 reps Quad Sets: AROM;Left;10 reps Towel Squeeze: AROM;Both;10 reps Heel Slides: AAROM;Left;10 reps   Assessment/Plan    PT Assessment Patient needs continued PT services  PT Problem List Decreased strength;Decreased range of motion;Decreased activity tolerance;Decreased balance;Decreased mobility;Decreased knowledge of use of DME;Decreased knowledge of precautions;Pain       PT Treatment Interventions DME instruction;Gait training;Functional mobility training;Stair training;Therapeutic activities;Therapeutic exercise;Balance training;Patient/family education;Manual techniques;Modalities    PT Goals (Current goals can be found in the Care Plan section)  Acute Rehab PT Goals Patient Stated Goal: to get her knee better so she can have her ankle possibly re done PT Goal Formulation: With patient Time For Goal Achievement: 12/05/17 Potential to Achieve Goals: Good    Frequency 7X/week           AM-PAC PT "6 Clicks" Daily Activity  Outcome Measure Difficulty turning over in bed (including adjusting bedclothes, sheets and blankets)?: A Little Difficulty moving from lying on back to sitting on the side of the bed? : A Little Difficulty sitting down on and standing up from a chair with arms (e.g., wheelchair, bedside commode, etc,.)?: Unable Help needed moving to and from a bed to chair (including a wheelchair)?: A Little Help needed walking in hospital room?: A  Little Help needed climbing 3-5 steps with a railing? : A Little 6 Click Score: 16    End of Session Equipment Utilized During Treatment: Left knee immobilizer Activity Tolerance: Patient limited by pain Patient left: in chair;with call bell/phone within reach   PT Visit Diagnosis: Muscle weakness (generalized) (M62.81);Difficulty in walking, not elsewhere classified (R26.2);Pain Pain - Right/Left: Left Pain - part of body: Knee    Time: 6599-3570 PT Time Calculation (min) (ACUTE ONLY): 48 min   Charges:           Wells Guiles B. Hilda Rynders, PT, DPT [phone]   PT Evaluation $PT Eval Moderate Complexity: 1 Mod PT Treatments $Gait Training: 8-22 mins $Therapeutic Activity: 8-22 mins   11/28/2017, 5:34 PM

## 2017-11-28 NOTE — H&P (Signed)
PREOPERATIVE H&P  Chief Complaint: Left knee pain  HPI: Alicia Rice is a 57 y.o. female who presents for preoperative history and physical with a diagnosis of left knee primary localized osteoarthritis. Symptoms are rated as moderate to severe, and have been worsening.  This is significantly impairing activities of daily living.  She has elected for surgical management.   She has failed injections, activity modification, anti-inflammatories, and assistive devices.  Preoperative X-rays demonstrate end stage degenerative changes with osteophyte formation, loss of joint space, subchondral sclerosis.   Past Medical History:  Diagnosis Date  . Acute carpal tunnel syndrome of left wrist 07/07/2017  . Arthritis    lt knee  . Chronic back pain   . Closed fracture of left distal radius 07/07/2017  . Depression   . Distal radius fracture, left   . GERD (gastroesophageal reflux disease)   . Hypertension   . Loose, teeth    bottom front  . PONV (postoperative nausea and vomiting)    Past Surgical History:  Procedure Laterality Date  . CARPAL TUNNEL RELEASE Left 07/07/2017   Procedure: LEFT CARPAL TUNNEL RELEASE;  Surgeon: Marchia Bond, MD;  Location: [facility];  Service: Orthopedics;  Laterality: Left;  . DILATION AND CURETTAGE OF UTERUS    . ELBOW SURGERY     nerve surgery  . FOOT SURGERY     2011 & fusion of bones 2016  . HYSTEROSCOPY    . MANDIBLE FRACTURE SURGERY    . NASAL SINUS SURGERY    . OPEN REDUCTION INTERNAL FIXATION (ORIF) DISTAL RADIAL FRACTURE Left 07/07/2017   Procedure: OPEN REDUCTION INTERNAL FIXATION (ORIF) LEFT DISTAL RADIAL FRACTURE;  Surgeon: Marchia Bond, MD;  Location: [facility];  Service: Orthopedics;  Laterality: Left;  . TEMPOROMANDIBULAR JOINT SURGERY    . TONSILLECTOMY     Social History   Socioeconomic History  . Marital status: Married    Spouse name: None  . Number of children: None  . Years of education:  None  . Highest education level: None  Social Needs  . Financial resource strain: None  . Food insecurity - worry: None  . Food insecurity - inability: None  . Transportation needs - medical: None  . Transportation needs - non-medical: None  Occupational History  . None  Tobacco Use  . Smoking status: Former Smoker    Packs/day: 0.25    Last attempt to quit: 07/03/2017    Years since quitting: 0.4  . Smokeless tobacco: Never Used  Substance and Sexual Activity  . Alcohol use: No  . Drug use: No  . Sexual activity: None  Other Topics Concern  . None  Social History Narrative  . None   Family History  Problem Relation Age of Onset  . Heart murmur Mother   . Hypertension Father   . Diabetes Father   . Arthritis Father   . Prostate cancer Father   . Graves' disease Father    No Known Allergies Prior to Admission medications   Medication Sig Start Date End Date Taking? Authorizing Provider  amLODipine (NORVASC) 5 MG tablet Take 1 tablet (5 mg total) by mouth daily. 11/06/17  Yes [name], MD  amphetamine-dextroamphetamine (ADDERALL) 20 MG tablet Take 20 mg by mouth 3 (three) times daily.  08/07/15  Yes [provider]  cyclobenzaprine (FLEXERIL) 10 MG tablet Take 20 mg by mouth at bedtime.  08/01/15  Yes [provider]  DULoxetine (CYMBALTA) 60 MG capsule Take  60 mg by mouth 2 (two) times daily.    Yes [provider]  famotidine (PEPCID) 20 MG tablet Take 20 mg by mouth 2 (two) times daily as needed for heartburn or indigestion.   Yes [provider]  gabapentin (NEURONTIN) 600 MG tablet Take 300-600 mg by mouth every 4 (four) hours as needed (for pain/anxiety). Up to 6 times daily 08/19/15  Yes [provider]  Oxycodone HCl 10 MG TABS Take 1-2 tablets (10-20 mg total) by mouth 4 (four) times daily as needed. Patient taking differently: Take 10-20 mg by mouth 4 (four) times daily as needed (for pain.).  07/07/17  Yes  Marchia Bond, MD  oxyCODONE-acetaminophen (PERCOCET) 10-325 MG tablet Take 1 tablet by mouth every 6 (six) hours as needed for pain. 10/25/17  Yes [provider]  HYDROmorphone (DILAUDID) 2 MG tablet Take 1 tablet (2 mg total) by mouth every 4 (four) hours as needed for severe pain. Patient not taking: Reported on 11/06/2017 07/07/17   Marchia Bond, MD  sennosides-docusate sodium (SENOKOT-S) 8.6-50 MG tablet Take 2 tablets by mouth daily. Patient not taking: Reported on 11/06/2017 07/07/17   Marchia Bond, MD     Positive ROS: All other systems have been reviewed and were otherwise negative with the exception of those mentioned in the HPI and as above.  Physical Exam: General: Alert, no acute distress Cardiovascular: No pedal edema Respiratory: No cyanosis, no use of accessory musculature GI: No organomegaly, abdomen is soft and non-tender Skin: No lesions in the area of chief complaint Neurologic: Sensation intact distally Psychiatric: Patient is competent for consent with normal mood and affect Lymphatic: No axillary or cervical lymphadenopathy  MUSCULOSKELETAL: Left knee range of motion 0-120 degrees with pseudolaxity and positive crepitance and pain to palpation medially.  Assessment: Left anteromedial knee osteoarthritis   Plan: Plan for Procedure(s): UNICOMPARTMENTAL KNEE  The risks benefits and alternatives were discussed with the patient including but not limited to the risks of nonoperative treatment, versus surgical intervention including infection, bleeding, nerve injury,  blood clots, cardiopulmonary complications, morbidity, mortality, among others, and they were willing to proceed.    Patient's anticipated LOS is less than 2 midnights, meeting these requirements: - ASA Class I - Younger than 78 - Lives within 1 hour of care - Has a competent adult at home to recover with post-op recover - NO history of  - Chronic pain requiring opiods  - Diabetes  -  Coronary Artery Disease  - Heart failure  - Heart attack  - Stroke  - DVT/VTE  - Cardiac arrhythmia  - Respiratory Failure/COPD  - Renal failure  - Anemia  - Advanced Liver disease       [name], MD Cell [phone]   11/28/2017 9:43 AM

## 2017-11-28 NOTE — Anesthesia Procedure Notes (Signed)
Spinal  Patient location during procedure: OR Start time: 11/28/2017 11:06 AM End time: 11/28/2017 11:10 AM Staffing Anesthesiologist: Audry Pili, MD Performed: anesthesiologist  Preanesthetic Checklist Completed: patient identified, surgical consent, pre-op evaluation, timeout performed, IV checked, risks and benefits discussed and monitors and equipment checked Spinal Block Patient position: sitting Prep: DuraPrep Patient monitoring: heart rate, cardiac monitor, continuous pulse ox and blood pressure Approach: midline Location: L3-4 Injection technique: single-shot Needle Needle type: Pencan  Needle gauge: 24 G Additional Notes Functioning IV was confirmed and monitors were applied. Sterile prep and drape, including hand hygiene, mask, and sterile gloves were used. The patient was positioned and the spine was prepped. The skin was anesthetized with lidocaine. Free flow of clear CSF was obtained prior to injecting local anesthetic into the CSF. The spinal needle aspirated freely following injection. The needle was carefully withdrawn. The patient tolerated the procedure well. Consent was obtained prior to the procedure with all questions answered and concerns addressed. Risks including, but not limited to, bleeding, infection, nerve damage, paralysis, failed block, inadequate analgesia, allergic reaction, high spinal, itching, and headache were discussed and the patient wished to proceed.  Renold Don, MD

## 2017-11-29 ENCOUNTER — Encounter (HOSPITAL_COMMUNITY): Payer: Self-pay | Admitting: Orthopedic Surgery

## 2017-11-29 DIAGNOSIS — M1712 Unilateral primary osteoarthritis, left knee: Secondary | ICD-10-CM | POA: Diagnosis not present

## 2017-11-29 LAB — CBC
HCT: 35 % — ABNORMAL LOW (ref 36.0–46.0)
Hemoglobin: 11.9 g/dL — ABNORMAL LOW (ref 12.0–15.0)
MCH: 30.7 pg (ref 26.0–34.0)
MCHC: 34 g/dL (ref 30.0–36.0)
MCV: 90.4 fL (ref 78.0–100.0)
Platelets: 166 10*3/uL (ref 150–400)
RBC: 3.87 MIL/uL (ref 3.87–5.11)
RDW: 12.9 % (ref 11.5–15.5)
WBC: 5.6 10*3/uL (ref 4.0–10.5)

## 2017-11-29 LAB — BASIC METABOLIC PANEL
Anion gap: 9 (ref 5–15)
BUN: 17 mg/dL (ref 6–20)
CO2: 28 mmol/L (ref 22–32)
Calcium: 8.6 mg/dL — ABNORMAL LOW (ref 8.9–10.3)
Chloride: 100 mmol/L — ABNORMAL LOW (ref 101–111)
Creatinine, Ser: 0.71 mg/dL (ref 0.44–1.00)
GFR calc Af Amer: >60 mL/min (ref >60)
GFR calc non Af Amer: >60 mL/min (ref >60)
Glucose, Bld: 150 mg/dL — ABNORMAL HIGH (ref 65–99)
Potassium: 3.4 mmol/L — ABNORMAL LOW (ref 3.5–5.1)
Sodium: 137 mmol/L (ref 135–145)

## 2017-11-29 NOTE — Progress Notes (Signed)
RN gave pt discharge instructions and patient stated understanding awaiting ride for D/C

## 2017-11-29 NOTE — Progress Notes (Signed)
Patient ID: Alicia Rice, female   DOB: Jan 31, 1961, 57 y.o.   MRN: [id number]     Subjective:  Patient reports pain as mild.  Patient in bed and in no acute distress  Objective:   VITALS:   Vitals:   11/28/17 1458 11/28/17 1953 11/29/17 0101 11/29/17 0419  BP: (!) 108/58 107/67 107/69 131/74  Pulse: 71 81 76 75  Resp: 17 16    Temp: 98.2 F (36.8 C) 99.4 F (37.4 C) 98.6 F (37 C) 98.1 F (36.7 C)  TempSrc: Oral Oral Oral Oral  SpO2: 100% 95% 94% 94%    ABD soft Sensation intact distally Dorsiflexion/Plantar flexion intact Incision: dressing C/D/I and no drainage   Lab Results  Component Value Date   WBC 5.6 11/29/2017   HGB 11.9 (L) 11/29/2017   HCT 35.0 (L) 11/29/2017   MCV 90.4 11/29/2017   PLT 166 11/29/2017   BMET    Component Value Date/Time   NA 137 11/29/2017 0615   NA 137 11/24/2017 1337   K 3.4 (L) 11/29/2017 0615   CL 100 (L) 11/29/2017 0615   CO2 28 11/29/2017 0615   GLUCOSE 150 (H) 11/29/2017 0615   BUN 17 11/29/2017 0615   BUN 17 11/24/2017 1337   CREATININE 0.71 11/29/2017 0615   CALCIUM 8.6 (L) 11/29/2017 0615   GFRNONAA >60 11/29/2017 0615   GFRAA >60 11/29/2017 0615     Assessment/Plan: 1 Day Post-Op   Principal Problem:   Primary localized osteoarthritis of left knee Active Problems:   S/P knee replacement   Advance diet Up with therapy DC home after PT this afternoon WBAT Dry dressing PRN    Remonia Richter 11/29/2017, 7:50 AM   Marchia Bond, MD Cell [phone]

## 2017-11-29 NOTE — Discharge Summary (Signed)
Physician Discharge Summary  Patient ID: Alicia Rice MRN: [id number] DOB/AGE: 01-01-1961 57 y.o.  Admit date: 11/28/2017 Discharge date: 11/29/2017  Admission Diagnoses:  Primary localized osteoarthritis of left knee  Discharge Diagnoses:  Principal Problem:   Primary localized osteoarthritis of left knee Active Problems:   S/P knee replacement   Past Medical History:  Diagnosis Date  . Acute carpal tunnel syndrome of left wrist 07/07/2017  . Arthritis    lt knee  . Chronic back pain   . Closed fracture of left distal radius 07/07/2017  . Depression   . Distal radius fracture, left   . GERD (gastroesophageal reflux disease)   . Hypertension   . Loose, teeth    bottom front  . PONV (postoperative nausea and vomiting)     Surgeries: Procedure(s): UNICOMPARTMENTAL KNEE on 11/28/2017   Consultants (if any):   Discharged Condition: Improved  Hospital Course: Alicia Rice is an 57 y.o. female who was admitted 11/28/2017 with a diagnosis of Primary localized osteoarthritis of left knee and went to the operating room on 11/28/2017 and underwent the above named procedures.    She was given perioperative antibiotics:  Anti-infectives (From admission, onward)   Start     Dose/Rate Route Frequency Ordered Stop   11/28/17 1700  ceFAZolin (ANCEF) IVPB 2g/100 mL premix     2 g 200 mL/hr over 30 Minutes Intravenous Every 6 hours 11/28/17 1500 11/29/17 0036   11/28/17 0908  ceFAZolin (ANCEF) IVPB 2g/100 mL premix     2 g 200 mL/hr over 30 Minutes Intravenous On call to O.R. 11/28/17 0908 11/28/17 1110    .  She was given sequential compression devices, early ambulation, and xarelto for DVT prophylaxis.  She benefited maximally from the hospital stay and there were no complications.    Recent vital signs:  Vitals:   11/29/17 0915 11/29/17 1100  BP: 126/90 (!) 148/86  Pulse: 81 87  Resp:  18  Temp: 98.4 F (36.9 C) 98.4 F (36.9 C)  SpO2: 99% 97%    Recent  laboratory studies:  Lab Results  Component Value Date   HGB 11.9 (L) 11/29/2017   HGB 14.2 11/24/2017   HGB 14.5 11/17/2017   Lab Results  Component Value Date   WBC 5.6 11/29/2017   PLT 166 11/29/2017   No results found for: INR Lab Results  Component Value Date   NA 137 11/29/2017   K 3.4 (L) 11/29/2017   CL 100 (L) 11/29/2017   CO2 28 11/29/2017   BUN 17 11/29/2017   CREATININE 0.71 11/29/2017   GLUCOSE 150 (H) 11/29/2017    Discharge Medications:   Allergies as of 11/29/2017   No Known Allergies     Medication List    STOP taking these medications   oxyCODONE-acetaminophen 10-325 MG tablet Commonly known as:  PERCOCET     TAKE these medications   amLODipine 5 MG tablet Commonly known as:  NORVASC Take 1 tablet (5 mg total) by mouth daily.   amphetamine-dextroamphetamine 20 MG tablet Commonly known as:  ADDERALL Take 20 mg by mouth 3 (three) times daily.   cyclobenzaprine 10 MG tablet Commonly known as:  FLEXERIL Take 2 tablets (20 mg total) by mouth at bedtime.   CYMBALTA 60 MG capsule Generic drug:  DULoxetine Take 60 mg by mouth 2 (two) times daily.   famotidine 20 MG tablet Commonly known as:  PEPCID Take 20 mg by mouth 2 (two) times daily as needed for heartburn or  indigestion.   gabapentin 600 MG tablet Commonly known as:  NEURONTIN Take 300-600 mg by mouth every 4 (four) hours as needed (for pain/anxiety). Up to 6 times daily   HYDROmorphone 2 MG tablet Commonly known as:  DILAUDID Take 1 tablet (2 mg total) by mouth every 4 (four) hours as needed for severe pain.   Oxycodone HCl 10 MG Tabs Take 1-2 tablets (10-20 mg total) by mouth 4 (four) times daily as needed (for pain.).   rivaroxaban 10 MG Tabs tablet Commonly known as:  XARELTO Take 1 tablet (10 mg total) by mouth daily.   sennosides-docusate sodium 8.6-50 MG tablet Commonly known as:  SENOKOT-S Take 2 tablets by mouth daily. What changed:  Another medication with the same  name was added. Make sure you understand how and when to take each.   sennosides-docusate sodium 8.6-50 MG tablet Commonly known as:  SENOKOT-S Take 2 tablets by mouth daily. What changed:  You were already taking a medication with the same name, and this prescription was added. Make sure you understand how and when to take each.       Diagnostic Studies: Dg Knee Left Port  Result Date: 11/28/2017 CLINICAL DATA:  Status post hemiarthroplasty EXAM: PORTABLE LEFT KNEE - 1-2 VIEW COMPARISON:  None. FINDINGS: Frontal and lateral views obtained. The patient has undergone medial hemiarthroplasty on the left. Prosthetic components appear well seated. No acute fracture or dislocation. There is air and extensive fluid within the suprapatellar region. There is moderate narrowing of the patellofemoral joint. There is mild spurring in the patellofemoral joint as well as laterally. IMPRESSION: Prosthetic components medially appear well seated. No fracture or dislocation. Osteoarthritic change noted in the patellofemoral joint and to a lesser extent lateral compartment. Extensive fluid and air noted within the suprapatellar region. Electronically Signed   By: Lowella Grip III M.D.   On: 11/28/2017 13:51    Disposition: 01-Home or Self Care    Follow-up Information    Marchia Bond, MD. Schedule an appointment as soon as possible for a visit in 2 weeks.   Specialty:  Orthopedic Surgery Contact information: [address] [place] [address] [phone]            Signed: [name] 11/29/2017, 12:17 PM

## 2017-11-29 NOTE — Progress Notes (Signed)
Physical Therapy Treatment Patient Details Name: Alicia Rice MRN: [id number] DOB: Feb 10, 1961 Today's Date: 11/29/2017    History of Present Illness 57 y.o. female admitted on 11/28/17 for elective L unicompartmental TKA.  Pt with significant PMH of HTN, L distal radius fx s/p ORIF, chronic back pain, R calcaneal fx with multiple surgeries, elbow surgery, and mandible fx surgery.    PT Comments    Patient is making good progress with PT.  From a mobility standpoint anticipate patient will be ready for DC home when medically ready.    Follow Up Recommendations  Follow surgeon's recommendation for DC plan and follow-up therapies;Supervision for mobility/OOB     Equipment Recommendations  None recommended by PT    Recommendations for Other Services       Precautions / Restrictions Precautions Precautions: Knee Precaution Comments: knee precautions reviewed with pt Restrictions Weight Bearing Restrictions: Yes LLE Weight Bearing: Weight bearing as tolerated    Mobility  Bed Mobility Overal bed mobility: Independent                Transfers Overall transfer level: Needs assistance Equipment used: Rolling walker (2 wheeled) Transfers: Sit to/from Stand Sit to Stand: Min guard         General transfer comment: min guard for safety  Ambulation/Gait Ambulation/Gait assistance: Supervision Ambulation Distance (Feet): 200 Feet Assistive device: Rolling walker (2 wheeled) Gait Pattern/deviations: Step-through pattern;Decreased stance time - left;Decreased step length - right;Decreased weight shift to left;Antalgic Gait velocity: decreased   General Gait Details: mildly antalgic gait; cues for sequencing and L heel strike   Stairs Stairs: Yes   Stair Management: One rail Left;Step to pattern;Sideways Number of Stairs: 5 General stair comments: cues for sequencing and technique; min guard for safety  Wheelchair Mobility    Modified Rankin (Stroke Patients  Only)       Balance Overall balance assessment: Needs assistance Sitting-balance support: Feet supported;Bilateral upper extremity supported Sitting balance-Leahy Scale: Good     Standing balance support: Bilateral upper extremity supported Standing balance-Leahy Scale: Fair                              Cognition Arousal/Alertness: Awake/alert Behavior During Therapy: WFL for tasks assessed/performed Overall Cognitive Status: Within Functional Limits for tasks assessed                                        Exercises      General Comments        Pertinent Vitals/Pain Pain Assessment: Faces Faces Pain Scale: Hurts little more Pain Location: with ROM left knee Pain Descriptors / Indicators: Grimacing;Guarding Pain Intervention(s): Limited activity within patient's tolerance;Monitored during session;Premedicated before session;Repositioned    Home Living                      Prior Function            PT Goals (current goals can now be found in the care plan section) Acute Rehab PT Goals PT Goal Formulation: With patient Time For Goal Achievement: 12/05/17 Potential to Achieve Goals: Good Progress towards PT goals: Progressing toward goals    Frequency    7X/week      PT Plan Current plan remains appropriate    Co-evaluation  AM-PAC PT "6 Clicks" Daily Activity  Outcome Measure  Difficulty turning over in bed (including adjusting bedclothes, sheets and blankets)?: None Difficulty moving from lying on back to sitting on the side of the bed? : A Little Difficulty sitting down on and standing up from a chair with arms (e.g., wheelchair, bedside commode, etc,.)?: Unable Help needed moving to and from a bed to chair (including a wheelchair)?: A Little Help needed walking in hospital room?: A Little Help needed climbing 3-5 steps with a railing? : A Little 6 Click Score: 17    End of Session Equipment  Utilized During Treatment: Gait belt Activity Tolerance: Patient tolerated treatment well Patient left: with call bell/phone within reach;in bed Nurse Communication: Mobility status PT Visit Diagnosis: Muscle weakness (generalized) (M62.81);Difficulty in walking, not elsewhere classified (R26.2);Pain Pain - Right/Left: Left Pain - part of body: Knee     Time: 8329-1916 PT Time Calculation (min) (ACUTE ONLY): 37 min  Charges:  $Gait Training: 23-37 mins                    G Codes:       Earney Navy, PTA Pager: [phone]     Darliss Cheney 11/29/2017, 9:24 AM

## 2017-11-29 NOTE — Progress Notes (Signed)
No replacement needed for k of 3.4  [name], MD    Plan dc home today.

## 2017-11-29 NOTE — Progress Notes (Signed)
Pt K+ 3.4 this am. Gus Rankin notified.  Will hold for now and notify Dr. Mardelle Matte for how to proceed.  No new orders at this time.  AKingRNBSN

## 2017-12-25 ENCOUNTER — Ambulatory Visit: Payer: Self-pay | Admitting: Family Medicine

## 2018-11-16 ENCOUNTER — Other Ambulatory Visit: Payer: Self-pay | Admitting: Family Medicine

## 2018-11-16 DIAGNOSIS — I10 Essential (primary) hypertension: Secondary | ICD-10-CM

## 2019-09-03 ENCOUNTER — Encounter: Payer: Self-pay | Admitting: Family Medicine

## 2019-09-03 ENCOUNTER — Ambulatory Visit (INDEPENDENT_AMBULATORY_CARE_PROVIDER_SITE_OTHER): Payer: BC Managed Care – PPO | Admitting: Family Medicine

## 2019-09-03 VITALS — Ht 65.0 in | Wt 160.0 lb

## 2019-09-03 DIAGNOSIS — R05 Cough: Secondary | ICD-10-CM

## 2019-09-03 DIAGNOSIS — I889 Nonspecific lymphadenitis, unspecified: Secondary | ICD-10-CM | POA: Diagnosis not present

## 2019-09-03 DIAGNOSIS — J0140 Acute pansinusitis, unspecified: Secondary | ICD-10-CM

## 2019-09-03 DIAGNOSIS — R059 Cough, unspecified: Secondary | ICD-10-CM

## 2019-09-03 DIAGNOSIS — Z87898 Personal history of other specified conditions: Secondary | ICD-10-CM | POA: Insufficient documentation

## 2019-09-03 MED ORDER — AMOXICILLIN-POT CLAVULANATE 875-125 MG PO TABS: 1.0000 | ORAL_TABLET | Freq: Two times a day (BID) | ORAL | 1 refills | Status: DC

## 2019-09-03 NOTE — Progress Notes (Signed)
Patient: Alicia Rice Female    DOB: 02-21-1961   58 y.o.   MRN: [id number] Visit Date: 09/03/2019  Today's Provider: [name], MD   Chief Complaint  Patient presents with  . Sore Throat  . Generalized Body Aches  . Ear Pain   Subjective:    Virtual Visit via Telephone Note  I connected with Kearney Hard on 09/03/19 at  3:20 PM EST by telephone and verified that I am speaking with the correct person using two identifiers.  Location: Patient: home Provider: office   I discussed the limitations, risks, security and privacy concerns of performing an evaluation and management service by telephone and the availability of in person appointments. I also discussed with the patient that there may be a patient responsible charge related to this service. The patient expressed understanding and agreed to proceed.       Sore Throat  The current episode started in the past 7 days. The problem has been gradually worsening. The pain is worse on the left side. The pain is at a severity of 4/10. The pain is mild. Associated symptoms include congestion, coughing, ear pain, headaches, a hoarse voice and swollen glands. Pertinent negatives include no diarrhea, neck pain, shortness of breath or vomiting. She has tried NSAIDs for the symptoms. The treatment provided mild relief.  Patient is having significant dental issues lately and has lost several teeth and has a bone biopsy scheduled for next week due to this.  She did have successful foot surgery last year and is improving from this. The call today is because of 5 to 6 days of left facial/maxillary/jaw discomfort.  He is tired and has enlarged lymph nodes in the left side of her neck and has had a cough.  No shortness of breath. No one else in the house is sick and no known Covid exposure.  No Known Allergies   Current Outpatient Medications:  .  amphetamine-dextroamphetamine (ADDERALL) 20 MG tablet, Take 20 mg by mouth  3 (three) times daily. , Disp: , Rfl:  .  cyclobenzaprine (FLEXERIL) 10 MG tablet, Take 2 tablets (20 mg total) by mouth at bedtime., Disp: 30 tablet, Rfl: 0 .  DULoxetine (CYMBALTA) 60 MG capsule, Take by mouth., Disp: , Rfl:  .  famotidine (PEPCID) 20 MG tablet, Take 20 mg by mouth 2 (two) times daily as needed for heartburn or indigestion., Disp: , Rfl:  .  gabapentin (NEURONTIN) 600 MG tablet, Take 300-600 mg by mouth every 4 (four) hours as needed (for pain/anxiety). Up to 6 times daily, Disp: , Rfl:   Review of Systems  Constitutional: Positive for fatigue. Negative for chills and fever.  HENT: Positive for congestion, ear pain and hoarse voice.   Eyes: Negative.   Respiratory: Positive for cough. Negative for shortness of breath.   Cardiovascular: Negative.   Gastrointestinal: Negative for constipation, diarrhea, nausea and vomiting.  Endocrine: Negative.   Genitourinary: Negative.   Musculoskeletal: Negative.  Negative for neck pain.  Skin: Negative.   Allergic/Immunologic: Negative.   Neurological: Positive for headaches.  Hematological: Negative.   Psychiatric/Behavioral: Negative.     Social History   Tobacco Use  . Smoking status: Former Smoker    Packs/day: 0.25    Quit date: 07/03/2017    Years since quitting: 2.1  . Smokeless tobacco: Never Used  Substance Use Topics  . Alcohol use: No      Objective:   Ht 5\' 5"  (1.651  m)   Wt 160 lb (72.6 kg)   BMI 26.63 kg/m  Vitals:   09/03/19 1418  Weight: 160 lb (72.6 kg)  Height: 5\' 5"  (1.651 m)  Body mass index is 26.63 kg/m.   Physical Exam   No results found for any visits on 09/03/19.     Assessment & Plan     1. Lymphadenitis   2. Subacute pansinusitis Treat with Augmentin. - Novel Coronavirus, NAA ([facility]) - amoxicillin-clavulanate (AUGMENTIN) 875-125 MG tablet; Take 1 tablet by mouth 2 (two) times daily.  Dispense: 20 tablet; Refill: 1  3. Cough Obtain Covid test.  Patient is called and  given instructions on how to arrange this through [facility].  I discussed the assessment and treatment plan with the patient. The patient was provided an opportunity to ask questions and all were answered. The patient agreed with the plan and demonstrated an understanding of the instructions.   The patient was advised to call back or seek an in-person evaluation if the symptoms worsen or if the condition fails to improve as anticipated.  I provided 11 minutes of non-face-to-face time during this encounter.    Richard Cranford Mon, MD  [facility]

## 2021-11-26 NOTE — Progress Notes (Unsigned)
Established patient visit  I,Shameca Landen,acting as a scribe for [name], MD.,have documented all relevant documentation on the behalf of [name], MD,as directed by  [name], MD while in the presence of [name], MD.   Patient: Alicia Rice   DOB: 03/12/1961   61 y.o. Female  MRN: [id number] Visit Date: 11/29/2021  Today's healthcare provider: [name], MD   Chief Complaint  Patient presents with   Follow-up   Hypertension   Subjective    HPI  Hypertension, follow-up  BP Readings from Last 3 Encounters:  11/29/21 (!) 156/68  11/29/17 (!) 148/86  11/23/17 (!) 144/92   Wt Readings from Last 3 Encounters:  11/29/21 170 lb (77.1 kg)  09/03/19 160 lb (72.6 kg)  11/23/17 162 lb (73.5 kg)     She was last seen for hypertension 11-29-2017. BP at that visit was 148/86. Management since that visit includes. Patient currently not on medication.  She reports good compliance with treatment. She is not having side effects. none She is following a Regular diet. She is not exercising. She does not smoke.  Use of agents associated with hypertension: none.   Outside blood pressures are checks occasionally.   Pertinent labs: Lab Results  Component Value Date   CHOL 157 11/24/2017   HDL 55 11/24/2017   LDLCALC 95 11/24/2017   TRIG 35 11/24/2017   CHOLHDL 2.9 11/24/2017   Lab Results  Component Value Date   NA 137 11/29/2017   K 3.4 (L) 11/29/2017   CREATININE 0.71 11/29/2017   GFRNONAA >60 11/29/2017   GLUCOSE 150 (H) 11/29/2017   TSH 2.770 11/24/2017     The ASCVD Risk score (Arnett DK, et al., 2019) failed to calculate for the following reasons:   Cannot find a previous HDL lab   Cannot find a previous total cholesterol lab   ---------------------------------------------------------------------------------------------------   Medications: Outpatient Medications Prior to Visit  Medication Sig    adapalene (DIFFERIN) 0.1 % gel Apply topically at bedtime.   amLODipine (NORVASC) 5 MG tablet Take 5 mg by mouth daily.   amphetamine-dextroamphetamine (ADDERALL) 20 MG tablet Take 20 mg by mouth 3 (three) times daily.    cyclobenzaprine (FLEXERIL) 10 MG tablet Take 2 tablets (20 mg total) by mouth at bedtime.   doxepin (SINEQUAN) 10 MG capsule Take 10-30 mg by mouth at bedtime.   DULoxetine (CYMBALTA) 60 MG capsule Take by mouth 2 (two) times daily.   famotidine (PEPCID) 20 MG tablet Take 20 mg by mouth 2 (two) times daily as needed for heartburn or indigestion.   FLUoxetine (PROZAC) 20 MG capsule Take 20 mg by mouth daily.   gabapentin (NEURONTIN) 600 MG tablet Take 300-600 mg by mouth every 4 (four) hours as needed (for pain/anxiety). Up to 6 times daily   metroNIDAZOLE (METROCREAM) 0.75 % cream Apply topically daily.   [DISCONTINUED] amoxicillin-clavulanate (AUGMENTIN) 875-125 MG tablet Take 1 tablet by mouth 2 (two) times daily.   No facility-administered medications prior to visit.    Review of Systems  Constitutional:  Negative for appetite change, chills, fatigue and fever.  Respiratory:  Negative for chest tightness and shortness of breath.   Cardiovascular:  Negative for chest pain and palpitations.  Gastrointestinal:  Negative for abdominal pain, nausea and vomiting.  Neurological:  Negative for dizziness and weakness.   {Labs   Heme   Chem   Endocrine   Serology   Results Review (optional):23779}  Objective    BP (!) 156/68 (BP Location: Right Arm, Patient Position: Sitting, Cuff Size: Normal)    Pulse 81    Temp 98.2 F (36.8 C) (Temporal)    Resp 16    Wt 170 lb (77.1 kg)    SpO2 99%    BMI 28.29 kg/m  {Show previous vital signs (optional):23777}  Physical Exam  ***  No results found for any visits on 11/29/21.  Assessment & Plan     ***  No follow-ups on file.      {provider attestation***:1}   [name], MD  [facility] [phone] (phone) [phone] (fax)  [place]

## 2021-11-29 ENCOUNTER — Ambulatory Visit (INDEPENDENT_AMBULATORY_CARE_PROVIDER_SITE_OTHER): Payer: No Typology Code available for payment source | Admitting: Family Medicine

## 2021-11-29 ENCOUNTER — Encounter: Payer: Self-pay | Admitting: Family Medicine

## 2021-11-29 ENCOUNTER — Other Ambulatory Visit: Payer: Self-pay

## 2021-11-29 VITALS — BP 156/68 | HR 81 | Temp 98.2°F | Resp 16 | Wt 170.0 lb

## 2021-11-29 DIAGNOSIS — F324 Major depressive disorder, single episode, in partial remission: Secondary | ICD-10-CM | POA: Diagnosis not present

## 2021-11-29 DIAGNOSIS — M1990 Unspecified osteoarthritis, unspecified site: Secondary | ICD-10-CM | POA: Diagnosis not present

## 2021-11-29 DIAGNOSIS — K219 Gastro-esophageal reflux disease without esophagitis: Secondary | ICD-10-CM | POA: Diagnosis not present

## 2021-11-29 DIAGNOSIS — I10 Essential (primary) hypertension: Secondary | ICD-10-CM

## 2021-11-29 DIAGNOSIS — F458 Other somatoform disorders: Secondary | ICD-10-CM

## 2021-11-29 DIAGNOSIS — R748 Abnormal levels of other serum enzymes: Secondary | ICD-10-CM

## 2021-11-29 DIAGNOSIS — R0981 Nasal congestion: Secondary | ICD-10-CM

## 2021-11-29 MED ORDER — CYCLOBENZAPRINE HCL 10 MG PO TABS: 20.0000 mg | ORAL_TABLET | Freq: Every day | ORAL | 5 refills | Status: AC

## 2021-11-29 MED ORDER — FLUTICASONE PROPIONATE 50 MCG/ACT NA SUSP: 2.0000 | Freq: Every day | NASAL | 11 refills | Status: AC

## 2021-11-30 ENCOUNTER — Encounter: Payer: Self-pay | Admitting: Internal Medicine

## 2021-11-30 LAB — CBC WITH DIFFERENTIAL/PLATELET
Basophils Absolute: 0.1 10*3/uL (ref 0.0–0.2)
Basos: 1 %
EOS (ABSOLUTE): 0.2 10*3/uL (ref 0.0–0.4)
Eos: 3 %
Hematocrit: 19.9 % — ABNORMAL LOW (ref 34.0–46.6)
Hemoglobin: 5.2 g/dL — CL (ref 11.1–15.9)
Immature Grans (Abs): 0 10*3/uL (ref 0.0–0.1)
Immature Granulocytes: 0 %
Lymphocytes Absolute: 1.6 10*3/uL (ref 0.7–3.1)
Lymphs: 22 %
MCH: 17 pg — ABNORMAL LOW (ref 26.6–33.0)
MCHC: 26.1 g/dL — ABNORMAL LOW (ref 31.5–35.7)
MCV: 65 fL — ABNORMAL LOW (ref 79–97)
Monocytes Absolute: 0.6 10*3/uL (ref 0.1–0.9)
Monocytes: 8 %
Neutrophils Absolute: 5 10*3/uL (ref 1.4–7.0)
Neutrophils: 66 %
Platelets: 321 10*3/uL (ref 150–450)
RBC: 3.05 x10E6/uL — ABNORMAL LOW (ref 3.77–5.28)
RDW: 19.9 % — ABNORMAL HIGH (ref 11.7–15.4)
WBC: 7.5 10*3/uL (ref 3.4–10.8)

## 2021-11-30 LAB — LIPID PANEL
Chol/HDL Ratio: 3.6 ratio (ref 0.0–4.4)
Cholesterol, Total: 133 mg/dL (ref 100–199)
HDL: 37 mg/dL — ABNORMAL LOW (ref >39)
LDL Chol Calc (NIH): 72 mg/dL (ref 0–99)
Triglycerides: 134 mg/dL (ref 0–149)
VLDL Cholesterol Cal: 24 mg/dL (ref 5–40)

## 2021-11-30 LAB — COMPREHENSIVE METABOLIC PANEL
ALT: 17 IU/L (ref 0–32)
AST: 12 IU/L (ref 0–40)
Albumin/Globulin Ratio: 2.1 (ref 1.2–2.2)
Albumin: 4.4 g/dL (ref 3.8–4.9)
Alkaline Phosphatase: 76 IU/L (ref 44–121)
BUN/Creatinine Ratio: 32 — ABNORMAL HIGH (ref 12–28)
BUN: 18 mg/dL (ref 8–27)
Bilirubin Total: 0.2 mg/dL (ref 0.0–1.2)
CO2: 21 mmol/L (ref 20–29)
Calcium: 9.2 mg/dL (ref 8.7–10.3)
Chloride: 105 mmol/L (ref 96–106)
Creatinine, Ser: 0.56 mg/dL — ABNORMAL LOW (ref 0.57–1.00)
Globulin, Total: 2.1 g/dL (ref 1.5–4.5)
Glucose: 144 mg/dL — ABNORMAL HIGH (ref 70–99)
Potassium: 4.7 mmol/L (ref 3.5–5.2)
Sodium: 141 mmol/L (ref 134–144)
Total Protein: 6.5 g/dL (ref 6.0–8.5)
eGFR: 104 mL/min/{1.73_m2} (ref >59)

## 2021-11-30 LAB — TSH: TSH: 0.602 u[IU]/mL (ref 0.450–4.500)

## 2021-11-30 NOTE — Progress Notes (Signed)
Received a call from the lab about a critical lab value, hemoglobin 5.2. Called the patient and left a voice message, recommend presenting to the ER for further evaluation and work up.

## 2021-12-01 ENCOUNTER — Telehealth: Payer: Self-pay

## 2021-12-01 ENCOUNTER — Observation Stay (HOSPITAL_COMMUNITY)
Admission: EM | Admit: 2021-12-01 | Discharge: 2021-12-02 | Disposition: A | Discharge status: Home / Self Care | Payer: No Typology Code available for payment source | Attending: Internal Medicine | Admitting: Internal Medicine

## 2021-12-01 ENCOUNTER — Encounter (HOSPITAL_COMMUNITY): Payer: Self-pay

## 2021-12-01 ENCOUNTER — Observation Stay (HOSPITAL_COMMUNITY): Payer: No Typology Code available for payment source

## 2021-12-01 DIAGNOSIS — Z79899 Other long term (current) drug therapy: Secondary | ICD-10-CM | POA: Insufficient documentation

## 2021-12-01 DIAGNOSIS — F1021 Alcohol dependence, in remission: Secondary | ICD-10-CM | POA: Diagnosis not present

## 2021-12-01 DIAGNOSIS — I10 Essential (primary) hypertension: Secondary | ICD-10-CM | POA: Diagnosis not present

## 2021-12-01 DIAGNOSIS — Z87891 Personal history of nicotine dependence: Secondary | ICD-10-CM | POA: Diagnosis not present

## 2021-12-01 DIAGNOSIS — F909 Attention-deficit hyperactivity disorder, unspecified type: Secondary | ICD-10-CM | POA: Diagnosis not present

## 2021-12-01 DIAGNOSIS — K219 Gastro-esophageal reflux disease without esophagitis: Secondary | ICD-10-CM | POA: Insufficient documentation

## 2021-12-01 DIAGNOSIS — G8929 Other chronic pain: Secondary | ICD-10-CM | POA: Diagnosis present

## 2021-12-01 DIAGNOSIS — Z23 Encounter for immunization: Secondary | ICD-10-CM | POA: Diagnosis not present

## 2021-12-01 DIAGNOSIS — Z96652 Presence of left artificial knee joint: Secondary | ICD-10-CM | POA: Insufficient documentation

## 2021-12-01 DIAGNOSIS — R799 Abnormal finding of blood chemistry, unspecified: Secondary | ICD-10-CM | POA: Diagnosis present

## 2021-12-01 DIAGNOSIS — Z20822 Contact with and (suspected) exposure to covid-19: Secondary | ICD-10-CM | POA: Insufficient documentation

## 2021-12-01 DIAGNOSIS — D649 Anemia, unspecified: Secondary | ICD-10-CM | POA: Diagnosis not present

## 2021-12-01 DIAGNOSIS — K922 Gastrointestinal hemorrhage, unspecified: Secondary | ICD-10-CM

## 2021-12-01 LAB — CBC WITH DIFFERENTIAL/PLATELET
Abs Immature Granulocytes: 0.02 10*3/uL (ref 0.00–0.07)
Basophils Absolute: 0.1 10*3/uL (ref 0.0–0.1)
Basophils Relative: 1 %
Eosinophils Absolute: 0.3 10*3/uL (ref 0.0–0.5)
Eosinophils Relative: 4 %
HCT: 21.4 % — ABNORMAL LOW (ref 36.0–46.0)
Hemoglobin: 5.5 g/dL — CL (ref 12.0–15.0)
Immature Granulocytes: 0 %
Lymphocytes Relative: 25 %
Lymphs Abs: 2 10*3/uL (ref 0.7–4.0)
MCH: 17.8 pg — ABNORMAL LOW (ref 26.0–34.0)
MCHC: 25.7 g/dL — ABNORMAL LOW (ref 30.0–36.0)
MCV: 69.3 fL — ABNORMAL LOW (ref 80.0–100.0)
Monocytes Absolute: 0.7 10*3/uL (ref 0.1–1.0)
Monocytes Relative: 9 %
Neutro Abs: 4.8 10*3/uL (ref 1.7–7.7)
Neutrophils Relative %: 61 %
Platelets: 339 10*3/uL (ref 150–400)
RBC: 3.09 MIL/uL — ABNORMAL LOW (ref 3.87–5.11)
RDW: 21.9 % — ABNORMAL HIGH (ref 11.5–15.5)
WBC: 7.9 10*3/uL (ref 4.0–10.5)
nRBC: 0 % (ref 0.0–0.2)

## 2021-12-01 LAB — POC OCCULT BLOOD, ED: Fecal Occult Bld: NEGATIVE

## 2021-12-01 LAB — CBC
HCT: 22.6 % — ABNORMAL LOW (ref 36.0–46.0)
Hemoglobin: 5.6 g/dL — CL (ref 12.0–15.0)
MCH: 17.6 pg — ABNORMAL LOW (ref 26.0–34.0)
MCHC: 24.8 g/dL — ABNORMAL LOW (ref 30.0–36.0)
MCV: 71.1 fL — ABNORMAL LOW (ref 80.0–100.0)
Platelets: 329 10*3/uL (ref 150–400)
RBC: 3.18 MIL/uL — ABNORMAL LOW (ref 3.87–5.11)
RDW: 22.2 % — ABNORMAL HIGH (ref 11.5–15.5)
WBC: 9.7 10*3/uL (ref 4.0–10.5)
nRBC: 0 % (ref 0.0–0.2)

## 2021-12-01 LAB — COMPREHENSIVE METABOLIC PANEL
ALT: 18 U/L (ref 0–44)
AST: 14 U/L — ABNORMAL LOW (ref 15–41)
Albumin: 3.9 g/dL (ref 3.5–5.0)
Alkaline Phosphatase: 62 U/L (ref 38–126)
Anion gap: 7 (ref 5–15)
BUN: 25 mg/dL — ABNORMAL HIGH (ref 6–20)
CO2: 24 mmol/L (ref 22–32)
Calcium: 8.7 mg/dL — ABNORMAL LOW (ref 8.9–10.3)
Chloride: 103 mmol/L (ref 98–111)
Creatinine, Ser: 0.53 mg/dL (ref 0.44–1.00)
GFR, Estimated: >60 mL/min (ref >60)
Glucose, Bld: 113 mg/dL — ABNORMAL HIGH (ref 70–99)
Potassium: 3.8 mmol/L (ref 3.5–5.1)
Sodium: 134 mmol/L — ABNORMAL LOW (ref 135–145)
Total Bilirubin: 0.3 mg/dL (ref 0.3–1.2)
Total Protein: 6.9 g/dL (ref 6.5–8.1)

## 2021-12-01 LAB — PROTIME-INR
INR: 1 (ref 0.8–1.2)
Prothrombin Time: 13.3 seconds (ref 11.4–15.2)

## 2021-12-01 LAB — RETICULOCYTES
Immature Retic Fract: 43.1 % — ABNORMAL HIGH (ref 2.3–15.9)
RBC.: 3.19 MIL/uL — ABNORMAL LOW (ref 3.87–5.11)
Retic Count, Absolute: 88.7 10*3/uL (ref 19.0–186.0)
Retic Ct Pct: 2.8 % (ref 0.4–3.1)

## 2021-12-01 LAB — ETHANOL: Alcohol, Ethyl (B): <10 mg/dL (ref <10)

## 2021-12-01 LAB — FERRITIN: Ferritin: 3 ng/mL — ABNORMAL LOW (ref 11–307)

## 2021-12-01 LAB — TROPONIN I (HIGH SENSITIVITY)
Troponin I (High Sensitivity): 5 ng/L (ref <18)
Troponin I (High Sensitivity): 6 ng/L (ref <18)

## 2021-12-01 LAB — MAGNESIUM: Magnesium: 2.2 mg/dL (ref 1.7–2.4)

## 2021-12-01 LAB — IRON AND TIBC
Iron: 46 ug/dL (ref 28–170)
Saturation Ratios: 8 % — ABNORMAL LOW (ref 10.4–31.8)
TIBC: 542 ug/dL — ABNORMAL HIGH (ref 250–450)
UIBC: 496 ug/dL

## 2021-12-01 LAB — FOLATE: Folate: 24.1 ng/mL (ref >5.9)

## 2021-12-01 LAB — PREPARE RBC (CROSSMATCH)

## 2021-12-01 LAB — RESP PANEL BY RT-PCR (FLU A&B, COVID) ARPGX2
Influenza A by PCR: NEGATIVE
Influenza B by PCR: NEGATIVE
SARS Coronavirus 2 by RT PCR: NEGATIVE

## 2021-12-01 LAB — CK: Total CK: 43 U/L (ref 38–234)

## 2021-12-01 LAB — VITAMIN B12: Vitamin B-12: 255 pg/mL (ref 180–914)

## 2021-12-01 LAB — PHOSPHORUS: Phosphorus: 3.6 mg/dL (ref 2.5–4.6)

## 2021-12-01 LAB — ABO/RH: ABO/RH(D): A POS

## 2021-12-01 MED ORDER — SODIUM CHLORIDE 0.9 % IV SOLN
10.0000 mL/h | Freq: Once | INTRAVENOUS | Status: AC
  Administered 2021-12-01: 10 mL/h via INTRAVENOUS

## 2021-12-01 MED ORDER — FLUOXETINE HCL 20 MG PO CAPS
20.0000 mg | ORAL_CAPSULE | Freq: Every evening | ORAL | Status: DC
Start: 2021-12-02 — End: 2021-12-02

## 2021-12-01 MED ORDER — GABAPENTIN 400 MG PO CAPS
2400.0000 mg | ORAL_CAPSULE | Freq: Every day | ORAL | Status: DC
Start: 2021-12-01 — End: 2021-12-02
  Filled 2021-12-01: qty 6

## 2021-12-01 MED ORDER — ACETAMINOPHEN 650 MG RE SUPP: 650.0000 mg | Freq: Four times a day (QID) | RECTAL | Status: DC | PRN

## 2021-12-01 MED ORDER — SODIUM CHLORIDE 0.9% IV SOLUTION: Freq: Once | INTRAVENOUS | Status: DC

## 2021-12-01 MED ORDER — AMLODIPINE BESYLATE 5 MG PO TABS
5.0000 mg | ORAL_TABLET | Freq: Every day | ORAL | Status: DC
  Administered 2021-12-02: 5 mg via ORAL
  Filled 2021-12-01: qty 1

## 2021-12-01 MED ORDER — SODIUM CHLORIDE 0.9 % IV SOLN
75.0000 mL/h | INTRAVENOUS | Status: AC
  Administered 2021-12-02: 75 mL/h via INTRAVENOUS

## 2021-12-01 MED ORDER — HYDROCODONE-ACETAMINOPHEN 5-325 MG PO TABS: 1.0000 | ORAL_TABLET | ORAL | Status: DC | PRN

## 2021-12-01 MED ORDER — INFLUENZA VAC SPLIT QUAD 0.5 ML IM SUSY
0.5000 mL | PREFILLED_SYRINGE | INTRAMUSCULAR | Status: AC
  Administered 2021-12-02: 0.5 mL via INTRAMUSCULAR
  Filled 2021-12-01: qty 0.5

## 2021-12-01 MED ORDER — PANTOPRAZOLE SODIUM 40 MG IV SOLR
40.0000 mg | Freq: Two times a day (BID) | INTRAVENOUS | Status: DC
Start: 2021-12-01 — End: 2021-12-02
  Administered 2021-12-01 – 2021-12-02 (×2): 40 mg via INTRAVENOUS
  Filled 2021-12-01 (×2): qty 10

## 2021-12-01 MED ORDER — DULOXETINE HCL 30 MG PO CPEP: 120.0000 mg | ORAL_CAPSULE | Freq: Every evening | ORAL | Status: DC

## 2021-12-01 MED ORDER — ACETAMINOPHEN 325 MG PO TABS: 650.0000 mg | ORAL_TABLET | Freq: Four times a day (QID) | ORAL | Status: DC | PRN

## 2021-12-01 NOTE — H&P (Signed)
? ? ?Alicia Rice YKZ:[id number] DOB: 1961/07/20 DOA: 12/01/2021 ? ? ?  ?PCP: [name], MD   ?Outpatient Specialists:  ?  ? ?Patient arrived to ER on 12/01/21 at [facility] ?Referred by Attending Lennice Sites, DO ? ? ?Patient coming from:   ? home Lives  With family ?  ? ?Chief Complaint:   ?Chief Complaint  ?Patient presents with  ? Abnormal Lab  ? ? ?HPI: ?Alicia Rice is a 61 y.o. female with medical history significant of alcohol abuse GERD, HTN, migraines, arthritis, hx seizures ? ?Presented with  symptomatic anemia ?Patient was asked to go to emergency department because yesterday her hemoglobin was 4.2.  She has been feeling more tired than usual she does not have any blood in her stool.  She is not sure why she would be bleeding.  She does have mild chest pain and heartburn.  No shortness of breath has history of alcohol abuse GERD ?Excoriation on face chronic due to anxiety ? Reports been etoh free for 10 y ? Never had a coloscopy no GI MD ?No tobacco ?She takes BC aspirin on occasion but not recently ?Once a month ?She does naproxen 1-2 tabs 3 or 4 days a wks ? ? ? Initial COVID TEST  ?NEGATIVE  ? ?Lab Results  ?Component Value Date  ? [place] NEGATIVE 12/01/2021  ? ?  ?Regarding pertinent Chronic problems:  ?  ? ? HTN on Norvasc ?    ? ?While in ER: ?Clinical Course as of 12/01/21 2140  ?Wed Dec 01, 2021  ?1924 HgB 5.5. Fecal occult negative [LM]  ?2125 Troponin I (High Sensitivity) [LM]  ?  ?Clinical Course User Index ?[LM] Dorothyann Peng, PA-C  ?   ? ?Following Medications were ordered in ER: ?Medications  ?0.9 %  sodium chloride infusion (has no administration in time range)  ?  ?__________  ?  ?ED Triage Vitals [12/01/21 1906]  ?Enc Vitals Group  ?   BP (!) 145/87  ?   Pulse Rate 90  ?   Resp 18  ?   Temp 98 ?F (36.7 ?C)  ?   Temp Source Oral  ?   SpO2 99 %  ?   Weight   ?   Height   ?   Head Circumference   ?   Peak Flow   ?   Pain Score   ?   Pain Loc   ?   Pain Edu?   ?    Excl. in [place]?   ?LTJQ(30)@    ? _________________________________________ ?Significant initial  Findings: ?Abnormal Labs Reviewed  ?IRON AND TIBC - Abnormal; Notable for the following components:  ?    Result Value  ? TIBC 542 (*)   ? Saturation Ratios 8 (*)   ? All other components within normal limits  ?FERRITIN - Abnormal; Notable for the following components:  ? Ferritin 3 (*)   ? All other components within normal limits  ?RETICULOCYTES - Abnormal; Notable for the following components:  ? RBC. 3.19 (*)   ? Immature Retic Fract 43.1 (*)   ? All other components within normal limits  ?COMPREHENSIVE METABOLIC PANEL - Abnormal; Notable for the following components:  ? Sodium 134 (*)   ? Glucose, Bld 113 (*)   ? BUN 25 (*)   ? Calcium 8.7 (*)   ? AST 14 (*)   ? All other components within normal limits  ?CBC WITH DIFFERENTIAL/PLATELET - Abnormal; Notable  for the following components:  ? RBC 3.09 (*)   ? Hemoglobin 5.5 (*)   ? HCT 21.4 (*)   ? MCV 69.3 (*)   ? MCH 17.8 (*)   ? MCHC 25.7 (*)   ? RDW 21.9 (*)   ? All other components within normal limits  ? ?  __________________ ?Troponin 5  ?ECG: Ordered ?Personally reviewed by me showing: ?HR : 82 ?Rhythm: Short PR interval ?Minimal ST depression, diffuse leads ?QTC 445 ?  ? ?The recent clinical data is shown below. ?Vitals:  ? 12/01/21 1951 12/01/21 2000 12/01/21 2030 12/01/21 2045  ?BP: (!) 154/64 (!) 147/74 (!) 148/74 (!) 138/59  ?Pulse: 80 80 77 75  ?Resp: 17 18 (!) 21 19  ?Temp:      ?TempSrc:      ?SpO2: 99% 99% 97% 97%  ?  ?WBC ? ?   ?Component Value Date/Time  ? WBC 7.9 12/01/2021 1946  ? LYMPHSABS 2.0 12/01/2021 1946  ? LYMPHSABS 1.6 11/29/2021 1536  ? MONOABS 0.7 12/01/2021 1946  ? EOSABS 0.3 12/01/2021 1946  ? EOSABS 0.2 11/29/2021 1536  ? BASOSABS 0.1 12/01/2021 1946  ? BASOSABS 0.1 11/29/2021 1536  ?   ? ? UA  ordered ?   ? ?Results for orders placed or performed during the hospital encounter of 12/01/21  ?Resp Panel by RT-PCR (Flu A&B, Covid)  Nasopharyngeal Swab     Status: None  ? Collection Time: 12/01/21  9:38 PM  ? Specimen: Nasopharyngeal Swab; Nasopharyngeal(NP) swabs in vial transport medium  ?Result Value Ref Range Status  ? SARS Coronavirus 2 by RT PCR NEGATIVE NEGATIVE Final  ?      ? Influenza A by PCR NEGATIVE NEGATIVE Final  ? Influenza B by PCR NEGATIVE NEGATIVE Final  ?      ?  ?_______________________________________________ ?Hospitalist was called for admission for symptomatic anemia ? ?The following Work up has been ordered so far: ? ?Orders Placed This Encounter  ?Procedures  ? Vitamin B12  ? Folate  ? Iron and TIBC  ? Ferritin  ? Reticulocytes  ? Comprehensive metabolic panel  ? CBC with Differential  ? Informed Consent Details: Physician/Practitioner Attestation; Transcribe to consent form and obtain patient signature  ? Consult to hospitalist  ? POC occult blood, ED  ? ED EKG  ? Type and screen [employer]  ? Prepare RBC (crossmatch)  ? ABO/Rh  ?  ? ?OTHER Significant initial  Findings: ? ?labs showing: ? ?  ?Recent Labs  ?Lab 11/29/21 ?1536 12/01/21 ?1946  ?NA 141 134*  ?K 4.7 3.8  ?CO2 21 24  ?GLUCOSE 144* 113*  ?BUN 18 25*  ?CREATININE 0.56* 0.53  ?CALCIUM 9.2 8.7*  ? ? ?Cr   stable,  ?Lab Results  ?Component Value Date  ? CREATININE 0.53 12/01/2021  ? CREATININE 0.56 (L) 11/29/2021  ? CREATININE 0.71 11/29/2017  ? ? ?Recent Labs  ?Lab 11/29/21 ?1536 12/01/21 ?1946  ?AST 12 14*  ?ALT 17 18  ?ALKPHOS 76 62  ?BILITOT 0.2 0.3  ?PROT 6.5 6.9  ?ALBUMIN 4.4 3.9  ? ?Lab Results  ?Component Value Date  ? CALCIUM 8.7 (L) 12/01/2021  ?  ?Plt: ?Lab Results  ?Component Value Date  ? PLT 339 12/01/2021  ?  ?COVID-19 Labs ? ?Recent Labs  ?  12/01/21 ?1946  ?FERRITIN 3*  ? ?  ?   ?Recent Labs  ?Lab 11/29/21 ?1536 12/01/21 ?1946  ?WBC 7.5 7.9  ?NEUTROABS 5.0 4.8  ?  HGB 5.2* 5.5*  ?HCT 19.9* 21.4*  ?MCV 65* 69.3*  ?PLT 321 339  ?  ?Cardiac Panel (last 3 results) ?Recent Labs  ?  12/01/21 ?2137  ?CKTOTAL 43  ?  ?     ?Cultures: ?No results found for: SDES, [place], [place], REPTSTATUS ?  ?Radiological Exams on Admission: ?No results found. ?_______________________________________________________________________________________________________ ?Latest  Blood pressure (!) 138/59, pulse 75, temperature 98 ?F (36.7 ?C), temperature source Oral, resp. rate 19, SpO2 97 %. ? ? Vitals  labs and radiology finding personally reviewed  ?Review of Systems:  ? ? Pertinent positives include:  fatigue, ? ?Constitutional:  ?No weight loss, night sweats, Fevers, chills,  weight loss  ?HEENT:  ?No headaches, Difficulty swallowing,Tooth/dental problems,Sore throat,  ?No sneezing, itching, ear ache, nasal congestion, post nasal drip,  ?Cardio-vascular:  ?No chest pain, Orthopnea, PND, anasarca, dizziness, palpitations.no Bilateral lower extremity swelling  ?GI:  ?No heartburn, indigestion, abdominal pain, nausea, vomiting, diarrhea, change in bowel habits, loss of appetite, melena, blood in stool, hematemesis ?Resp:  ?no shortness of breath at rest. No dyspnea on exertion, No excess mucus, no productive cough, No non-productive cough, No coughing up of blood.No change in color of mucus.No wheezing. ?Skin:  ?no rash or lesions. No jaundice ?GU:  ?no dysuria, change in color of urine, no urgency or frequency. No straining to urinate.  ?No flank pain.  ?Musculoskeletal:  ?No joint pain or no joint swelling. No decreased range of motion. No back pain.  ?Psych:  ?No change in mood or affect. No depression or anxiety. No memory loss.  ?Neuro: no localizing neurological complaints, no tingling, no weakness, no double vision, no gait abnormality, no slurred speech, no confusion ? ?All systems reviewed and apart from [place] all are negative ?_______________________________________________________________________________________________ ?Past Medical History: ?  ?Past Medical History:  ?Diagnosis Date  ? Acute carpal tunnel syndrome of left wrist 07/07/2017   ? Arthritis   ? lt knee  ? Chronic back pain   ? Closed fracture of left distal radius 07/07/2017  ? Depression   ? Distal radius fracture, left   ? GERD (gastroesophageal reflux disease)   ? Hypertension   ? Loose, teeth   ? bo

## 2021-12-01 NOTE — ED Provider Notes (Signed)
?[facility] DEPT ?Provider Note ? ? ?CSN: [id number] ?Arrival date & time: 12/01/21  1855 ? ?  ? ?History ? ?Chief Complaint  ?Patient presents with  ? Abnormal Lab  ? ? ?Alicia Rice is a 61 y.o. female.  Patient presents to the ED today at the recommendation of her primary care due to a lab value from 3/14 showing a hemoglobin of 5.2.  Patient does complain of feeling more tired than usual.  Denies blood in stool. Denies any known bleeding. Complains of mild substernal chest pain with no radiation similar to instances of known reflux. Denies shortness of breath. PMH significant for alcoholism, GERD, HTN, migraines, arthritis, hx seizures.  ? ?HPI ? ?  ? ?Home Medications ?Prior to Admission medications   ?Medication Sig Start Date End Date Taking? Authorizing Provider  ?adapalene (DIFFERIN) 0.1 % gel Apply 1 application. topically at bedtime.   Yes [provider]  ?amLODipine (NORVASC) 5 MG tablet Take 5 mg by mouth daily.   Yes [provider]  ?amphetamine-dextroamphetamine (ADDERALL) 20 MG tablet Take 20 mg by mouth 3 (three) times daily.  08/07/15  Yes [provider]  ?cyclobenzaprine (FLEXERIL) 10 MG tablet Take 2 tablets (20 mg total) by mouth at bedtime. 11/29/21  Yes [name], MD  ?doxepin (SINEQUAN) 10 MG capsule Take 10-30 mg by mouth at bedtime. 09/28/21  Yes [provider]  ?DULoxetine (CYMBALTA) 60 MG capsule Take 120 mg by mouth every evening.   Yes [provider]  ?famotidine (PEPCID) 20 MG tablet Take 20 mg by mouth 2 (two) times daily as needed for heartburn or indigestion.   Yes [provider]  ?FLUoxetine (PROZAC) 20 MG capsule Take 20 mg by mouth every evening. 09/27/21  Yes [provider]  ?fluticasone (FLONASE) 50 MCG/ACT nasal spray Place 2 sprays into both nostrils daily. ?Patient not taking: Reported on 12/01/2021 11/29/21   [name], MD  ?gabapentin (NEURONTIN) 600  MG tablet Take 300-600 mg by mouth every 4 (four) hours as needed (for pain/anxiety). Up to 6 times daily 08/19/15   [provider]  ?metroNIDAZOLE (METROCREAM) 0.75 % cream Apply topically daily. 09/30/21   [provider]  ?   ? ?Allergies    ?Patient has no known allergies.   ? ?Review of Systems   ?Review of Systems  ?Constitutional:  Positive for fatigue. Negative for fever.  ?Respiratory:  Negative for shortness of breath.   ?Cardiovascular:  Positive for chest pain.  ?Gastrointestinal:  Negative for abdominal pain, blood in stool, diarrhea and vomiting.  ?Genitourinary:  Negative for dysuria and hematuria.  ?Skin:  Negative for pallor.  ? ?Physical Exam ?Updated Vital Signs ?BP 128/79   Pulse 78   Temp 98 ?F (36.7 ?C) (Oral)   Resp (!) 23   SpO2 100%  ?Physical Exam ?Constitutional:   ?   General: She is not in acute distress. ?   Appearance: She is normal weight.  ?HENT:  ?   Head: Normocephalic.  ?Eyes:  ?   Conjunctiva/sclera: Conjunctivae normal.  ?Cardiovascular:  ?   Rate and Rhythm: Normal rate and regular rhythm.  ?   Pulses: Normal pulses.  ?   Heart sounds: Normal heart sounds.  ?Pulmonary:  ?   Effort: Pulmonary effort is normal.  ?   Breath sounds: Normal breath sounds.  ?Abdominal:  ?   Palpations: Abdomen is soft.  ?   Tenderness: There is no abdominal  tenderness.  ?Musculoskeletal:  ?   Cervical back: Normal range of motion.  ?Skin: ?   General: Skin is warm and dry.  ?Neurological:  ?   Mental Status: She is alert.  ? ? ?ED Results / Procedures / Treatments   ?Labs ?(all labs ordered are listed, but only abnormal results are displayed) ?Labs Reviewed  ?IRON AND TIBC - Abnormal; Notable for the following components:  ?    Result Value  ? TIBC 542 (*)   ? Saturation Ratios 8 (*)   ? All other components within normal limits  ?FERRITIN - Abnormal; Notable for the following components:  ? Ferritin 3 (*)   ? All other components within normal limits  ?RETICULOCYTES - Abnormal;  Notable for the following components:  ? RBC. 3.19 (*)   ? Immature Retic Fract 43.1 (*)   ? All other components within normal limits  ?COMPREHENSIVE METABOLIC PANEL - Abnormal; Notable for the following components:  ? Sodium 134 (*)   ? Glucose, Bld 113 (*)   ? BUN 25 (*)   ? Calcium 8.7 (*)   ? AST 14 (*)   ? All other components within normal limits  ?CBC WITH DIFFERENTIAL/PLATELET - Abnormal; Notable for the following components:  ? RBC 3.09 (*)   ? Hemoglobin 5.5 (*)   ? HCT 21.4 (*)   ? MCV 69.3 (*)   ? MCH 17.8 (*)   ? MCHC 25.7 (*)   ? RDW 21.9 (*)   ? All other components within normal limits  ?RESP PANEL BY RT-PCR (FLU A&B, COVID) ARPGX2  ?VITAMIN B12  ?FOLATE  ?CBC  ?CBC  ?CK  ?MAGNESIUM  ?PHOSPHORUS  ?PROTIME-INR  ?POC OCCULT BLOOD, ED  ?TYPE AND SCREEN  ?PREPARE RBC (CROSSMATCH)  ?ABO/RH  ?TROPONIN I (HIGH SENSITIVITY)  ?TROPONIN I (HIGH SENSITIVITY)  ? ? ?EKG ?EKG Interpretation ? ?Date/Time:  Wednesday December 01 2021 19:37:40 EDT ?Ventricular Rate:  82 ?PR Interval:  104 ?QRS Duration: 90 ?QT Interval:  381 ?QTC Calculation: 445 ?R Axis:   35 ?Text Interpretation: Sinus rhythm Short PR interval Minimal ST depression, diffuse leads Confirmed by Lennice Sites [id number]) on 12/01/2021 8:45:01 PM ? ?Radiology ?No results found. ? ?Procedures ?Marland KitchenCritical Care ?Performed by: Dorothyann Peng, PA-C ?Authorized by: Dorothyann Peng, PA-C  ? ?Critical care provider statement:  ?  Critical care time (minutes):  30 ?  Critical care time was exclusive of:  Separately billable procedures and treating other patients ?  Critical care was necessary to treat or prevent imminent or life-threatening deterioration of the following conditions: Symptomatic anemia. ?  Critical care was time spent personally by me on the following activities:  Development of treatment plan with patient or surrogate, discussions with consultants, evaluation of patient's response to treatment, examination of patient, ordering and review of  laboratory studies, ordering and review of radiographic studies, ordering and performing treatments and interventions, pulse oximetry, re-evaluation of patient's condition and review of old charts ?  Care discussed with: admitting provider    ? ?Medications Ordered in ED ?Medications  ?0.9 %  sodium chloride infusion (has no administration in time range)  ?pantoprazole (PROTONIX) injection 40 mg (has no administration in time range)  ? ? ?ED Course/ Medical Decision Making/ A&P ?Clinical Course as of 12/01/21 2130  ?Wed Dec 01, 2021  ?1924 HgB 5.5. Fecal occult negative [LM]  ?2125 Troponin I (High Sensitivity) [LM]  ?  ?Clinical Course User Index ?[LM] Dorothyann Peng, PA-C  ? ?                        ?  Medical Decision Making ?Amount and/or Complexity of Data Reviewed ?Labs: ordered. ? ?Risk ?Prescription drug management. ?Decision regarding hospitalization. ? ? ?The patient presents with an abnormal lab value from an outside provider. Hgb was found to be low at 5.2.  The patient does endorse a feeling of tiredness at this time. Repeat Hgb was found to be 5.5.  ? ?The patient was type and matched and 2 units PRBC were ordered for transfusion.   ? ?Anemia panel ordered.  Pertinent lab values include Hgb 5.5, Ferritin 3, TIBC 542, MCV 69.3, MCH 17.8, RBC 3.19, Immature retic 43.1.  Troponin 5 ? ?Patient did complain of mild chest pain that she then stated may be reflux. Troponin 5, EKG with no ischemic changes. ACS unlikely ? ?I discussed the case with the hospitalist, Dr.Doutova, and went over lab work and patient presentation. Dr. Roel Cluck agreed to admit the patient. Covid-19 test ordered for admission.  ? ?Unknown origin of anemia at this time ? ? ?Final Clinical Impression(s) / ED Diagnoses ?Final diagnoses:  ?Symptomatic anemia  ? ? ?Rx / DC Orders ?ED Discharge Orders   ? ? None  ? ?  ? ? ?  ?Dorothyann Peng, PA-C ?12/01/21 2131 ? ?  ?Lennice Sites, DO ?12/01/21 2203 ? ?

## 2021-12-01 NOTE — ED Triage Notes (Addendum)
Pt arrived via POV, c/o abnormal lab. States she went for blood work the other day, hemoglobin 5.2. Endorses increased fatigue. Denies any bleeding.  ?

## 2021-12-01 NOTE — Telephone Encounter (Signed)
Patient was advised and she is heading to ED today. ?

## 2021-12-01 NOTE — Assessment & Plan Note (Addendum)
In the setting of symptomatic anemia ?CE trop neg ?ecg no ischemic ?Suspect GI related ? now resolved ?

## 2021-12-01 NOTE — Assessment & Plan Note (Signed)
Reports no alcohol for past 10 years.  Is here GI recommendations will order right upper quadrant ultrasound to further evaluate liver parenchyma ?

## 2021-12-01 NOTE — Assessment & Plan Note (Signed)
continue ppi ?

## 2021-12-01 NOTE — Assessment & Plan Note (Addendum)
Transfuse 2 units ?Anemia panel appears iron deficiency ?PPI BID ?RUQ [place] ?LB GI consult in AM ?Possible upper gi bleed slow, pt with GERD on chronic NSAID ? ?

## 2021-12-01 NOTE — Telephone Encounter (Signed)
Copied from [place] [id number]. Topic: General - Other ?>> Dec 01, 2021  3:33 PM Leward Quan A wrote: ?Reason for CRM: Patient called in to say that she received a message about a critical number on her labs asking for a call back since there were no notes stating that [facility] nurse could disclose information. Can be reached  at Ph# [phone] ?

## 2021-12-01 NOTE — Assessment & Plan Note (Signed)
Hold adderall for tonight ? ?

## 2021-12-01 NOTE — Telephone Encounter (Addendum)
Dr. Rosanna Randy was advised of critical lab. ?

## 2021-12-01 NOTE — Assessment & Plan Note (Signed)
Allow permissive htn ?

## 2021-12-01 NOTE — Subjective & Objective (Signed)
Patient was asked to go to emergency department because yesterday her hemoglobin was 4.2.  She has been feeling more tired than usual she does not have any blood in her stool.  She is not sure why she would be bleeding.  She does have mild chest pain and heartburn.  No shortness of breath has history of alcohol abuse GERD ?

## 2021-12-02 ENCOUNTER — Telehealth: Payer: Self-pay

## 2021-12-02 ENCOUNTER — Other Ambulatory Visit: Payer: Self-pay

## 2021-12-02 DIAGNOSIS — D649 Anemia, unspecified: Secondary | ICD-10-CM | POA: Diagnosis not present

## 2021-12-02 LAB — CBC WITH DIFFERENTIAL/PLATELET
Abs Immature Granulocytes: 0.04 10*3/uL (ref 0.00–0.07)
Basophils Absolute: 0.1 10*3/uL (ref 0.0–0.1)
Basophils Relative: 2 %
Eosinophils Absolute: 0.3 10*3/uL (ref 0.0–0.5)
Eosinophils Relative: 4 %
HCT: 28.3 % — ABNORMAL LOW (ref 36.0–46.0)
Hemoglobin: 8.2 g/dL — ABNORMAL LOW (ref 12.0–15.0)
Immature Granulocytes: 1 %
Lymphocytes Relative: 28 %
Lymphs Abs: 1.8 10*3/uL (ref 0.7–4.0)
MCH: 21.4 pg — ABNORMAL LOW (ref 26.0–34.0)
MCHC: 29 g/dL — ABNORMAL LOW (ref 30.0–36.0)
MCV: 73.7 fL — ABNORMAL LOW (ref 80.0–100.0)
Monocytes Absolute: 0.6 10*3/uL (ref 0.1–1.0)
Monocytes Relative: 9 %
Neutro Abs: 3.8 10*3/uL (ref 1.7–7.7)
Neutrophils Relative %: 56 %
Platelets: 250 10*3/uL (ref 150–400)
RBC: 3.84 MIL/uL — ABNORMAL LOW (ref 3.87–5.11)
RDW: 23.8 % — ABNORMAL HIGH (ref 11.5–15.5)
WBC: 6.6 10*3/uL (ref 4.0–10.5)
nRBC: 0.3 % — ABNORMAL HIGH (ref 0.0–0.2)

## 2021-12-02 LAB — URINALYSIS, ROUTINE W REFLEX MICROSCOPIC
Bilirubin Urine: NEGATIVE
Glucose, UA: NEGATIVE mg/dL
Hgb urine dipstick: NEGATIVE
Ketones, ur: NEGATIVE mg/dL
Leukocytes,Ua: NEGATIVE
Nitrite: NEGATIVE
Protein, ur: NEGATIVE mg/dL
Specific Gravity, Urine: 1.01 (ref 1.005–1.030)
pH: 6 (ref 5.0–8.0)

## 2021-12-02 LAB — COMPREHENSIVE METABOLIC PANEL
ALT: 10 U/L (ref 0–44)
AST: 13 U/L — ABNORMAL LOW (ref 15–41)
Albumin: 4 g/dL (ref 3.5–5.0)
Alkaline Phosphatase: 59 U/L (ref 38–126)
Anion gap: 5 (ref 5–15)
BUN: 19 mg/dL (ref 6–20)
CO2: 27 mmol/L (ref 22–32)
Calcium: 8.4 mg/dL — ABNORMAL LOW (ref 8.9–10.3)
Chloride: 104 mmol/L (ref 98–111)
Creatinine, Ser: 0.56 mg/dL (ref 0.44–1.00)
GFR, Estimated: >60 mL/min (ref >60)
Glucose, Bld: 91 mg/dL (ref 70–99)
Potassium: 4 mmol/L (ref 3.5–5.1)
Sodium: 136 mmol/L (ref 135–145)
Total Bilirubin: 0.9 mg/dL (ref 0.3–1.2)
Total Protein: 7 g/dL (ref 6.5–8.1)

## 2021-12-02 LAB — RAPID URINE DRUG SCREEN, HOSP PERFORMED
Amphetamines: NOT DETECTED
Barbiturates: NOT DETECTED
Benzodiazepines: NOT DETECTED
Cocaine: NOT DETECTED
Opiates: NOT DETECTED
Tetrahydrocannabinol: NOT DETECTED

## 2021-12-02 LAB — HIV ANTIBODY (ROUTINE TESTING W REFLEX): HIV Screen 4th Generation wRfx: NONREACTIVE

## 2021-12-02 LAB — PHOSPHORUS: Phosphorus: 4.1 mg/dL (ref 2.5–4.6)

## 2021-12-02 LAB — MAGNESIUM: Magnesium: 2.2 mg/dL (ref 1.7–2.4)

## 2021-12-02 LAB — TSH: TSH: 0.801 u[IU]/mL (ref 0.350–4.500)

## 2021-12-02 MED ORDER — ALPRAZOLAM 0.25 MG PO TABS
0.2500 mg | ORAL_TABLET | Freq: Once | ORAL | Status: AC
  Administered 2021-12-02: 0.25 mg via ORAL
  Filled 2021-12-02: qty 1

## 2021-12-02 MED ORDER — VITAMIN B-12 1000 MCG PO TABS: 1000.0000 ug | ORAL_TABLET | Freq: Every day | ORAL | 0 refills | Status: DC

## 2021-12-02 MED ORDER — CYANOCOBALAMIN 1000 MCG/ML IJ SOLN
1000.0000 ug | Freq: Once | INTRAMUSCULAR | Status: AC
  Administered 2021-12-02: 1000 ug via SUBCUTANEOUS
  Filled 2021-12-02: qty 1

## 2021-12-02 MED ORDER — FERROUS SULFATE 325 (65 FE) MG PO TBEC: 325.0000 mg | DELAYED_RELEASE_TABLET | Freq: Two times a day (BID) | ORAL | 3 refills | Status: DC

## 2021-12-02 MED ORDER — PNEUMOCOCCAL 20-VAL CONJ VACC 0.5 ML IM SUSY: 0.5000 mL | PREFILLED_SYRINGE | INTRAMUSCULAR | Status: DC

## 2021-12-02 NOTE — Telephone Encounter (Signed)
-----   Message from Levin Erp, [place] sent at 12/02/2021 11:37 AM EDT ----- ?Regarding: EGD and Colonoscopy ?This patient needs follow-up in clinic within the next 2 to 3 weeks with me or Dr. Silverio Decamp.  This is for follow-up of anemia.  We will need to have repeat CBC prior to that visit and will need EGD/colonoscopy with Dr. Silverio Decamp scheduled as well.  If you could go ahead and get her scheduled so this is not delayed that would be great. ? ?Thanks, JL L ? ?

## 2021-12-02 NOTE — Telephone Encounter (Signed)
Patient has been scheduled for a 3-week hospital follow up with Ellouise Newer, PA-C on Tuesday, 12/21/21 at 11:30 am. Patient has been scheduled for an EGD/colon with Dr. Silverio Decamp on Wednesday, 01/05/22 at 3 pm. Pt will need to arrive at 2 pm with a care partner. Pt will receive instructions at the time of her office visit. Appts will be on patient's hospital discharge papers. Will mail patient a letter with office appt information. ? ?Pt will need labs on 12/20/21. Lab order and reminder in epic. ? ?Ambulatory referral to GI in epic. ?

## 2021-12-02 NOTE — Consult Note (Addendum)
? ? ? Consultation ? ?Referring Provider:  Dr. Wynelle Cleveland    ?Primary Care Physician:  [name], MD ?Primary Gastroenterologist:  Althia Forts       ?Reason for Consultation: Anemia     ? ? Attending physician's note  ?I have taken a history, reviewed the chart and examined the patient. I performed a substantive portion of this encounter, including complete performance of at least one of the key components, in conjunction with the APP. I agree with the APP's note, impression and recommendations.  ?  ?61 year old female with past history of alcohol abuse, quit EtOH 10 years ago, frequent use of NSAIDs admitted with severe iron deficiency anemia ? ?Denies any GI specific symptoms, melena or rectal bleeding.  Fecal Hemoccult negative ?She was having extreme fatigue and dyspnea on exertion ? ?Currently hemodynamically stable ? ?Hemoglobin improved to 8 s/p 2 unit PRBC transfusion ?Ferritin 3 ? ?Patient will benefit from EGD and colonoscopy for further evaluation of severe iron deficiency anemia, we will tentatively schedule it while she is inpatient for tomorrow based on endoscopy unit availability, if patient is able to stay hospitalized per her insurance requirement given she is currently under obs status ?If not we will plan for outpatient GI follow-up with EGD and colonoscopy to be scheduled as outpatient ? ?Clear liquids ?Bowel prep ?N.p.o. after midnight ? ?The risks and benefits as well as alternatives of endoscopic procedure(s) have been discussed and reviewed. All questions answered. The patient agrees to proceed. ? ?The patient was provided an opportunity to ask questions and all were answered. The patient agreed with the plan and demonstrated an understanding of the instructions. ? ?K. Denzil Magnuson , MD ?[phone]    ?       ? HPI:   ?Alicia Rice is a 61 y.o. female with a past medical history as listed below including GERD, alcohol abuse, seizures and hypertension who presented to the  hospital with anemia. ?   At time of admission patient had been asked to go to the emergency department due to a hemoglobin of 4.2.  She had been feeling more tired than usual and described some mild chest pain and heartburn.  Did describe Naproxen use 1-2 tabs 3-4 days a week. ?   Today, the patient tells me that she was feeling fine but she had just not been to the doctor since the pandemic and wanting to go in and get some of her blood pressure meds refilled etc., they did labs and she was told she had a hemoglobin of 4.2.  Since she has been thinking about it more she has been more tired than usual and is maybe had some more difficulty breathing, but really took no notice of this until she has been in the hospital.  Denies seeing any change in her bowel habits or blood in her stools.  Does have chronic reflux controlled on Pepcid at home.  Denies any previous GI evaluation. ?   Denies fever, chills, weight loss, abdominal pain, nausea or vomiting. ? ?ER course: Hemoglobin 5.5, fecal occult negative, ordered 2 units PRBCs ? ?GI history: ?None ? ?Past Medical History:  ?Diagnosis Date  ? Acute carpal tunnel syndrome of left wrist 07/07/2017  ? Arthritis   ? lt knee  ? Chronic back pain   ? Closed fracture of left distal radius 07/07/2017  ? Depression   ? Distal radius fracture, left   ? GERD (gastroesophageal reflux disease)   ? Hypertension   ?  Loose, teeth   ? bottom front  ? PONV (postoperative nausea and vomiting)   ? ? ?Past Surgical History:  ?Procedure Laterality Date  ? CARPAL TUNNEL RELEASE Left 07/07/2017  ? Procedure: LEFT CARPAL TUNNEL RELEASE;  Surgeon: Marchia Bond, MD;  Location: [facility];  Service: Orthopedics;  Laterality: Left;  ? DILATION AND CURETTAGE OF UTERUS    ? ELBOW SURGERY    ? nerve surgery  ? FOOT SURGERY    ? 2011 & fusion of bones 2016  ? HYSTEROSCOPY    ? MANDIBLE FRACTURE SURGERY    ? NASAL SINUS SURGERY    ? OPEN REDUCTION INTERNAL FIXATION (ORIF) DISTAL RADIAL  FRACTURE Left 07/07/2017  ? Procedure: OPEN REDUCTION INTERNAL FIXATION (ORIF) LEFT DISTAL RADIAL FRACTURE;  Surgeon: Marchia Bond, MD;  Location: [facility];  Service: Orthopedics;  Laterality: Left;  ? PARTIAL KNEE ARTHROPLASTY Left 11/28/2017  ? Procedure: UNICOMPARTMENTAL KNEE;  Surgeon: Marchia Bond, MD;  Location: [facility];  Service: Orthopedics;  Laterality: Left;  ? TEMPOROMANDIBULAR JOINT SURGERY    ? TONSILLECTOMY    ? ? ?Family History  ?Problem Relation Age of Onset  ? Heart murmur Mother   ? Hypertension Father   ? Diabetes Father   ? Arthritis Father   ? Prostate cancer Father   ? Graves' disease Father   ? ? ?Social History  ? ?Tobacco Use  ? Smoking status: Former  ?  Packs/day: 0.25  ?  Types: Cigarettes  ?  Quit date: 07/03/2017  ?  Years since quitting: 4.4  ? Smokeless tobacco: Never  ?Vaping Use  ? Vaping Use: Never used  ?Substance Use Topics  ? Alcohol use: No  ? Drug use: No  ? ? ?Prior to Admission medications   ?Medication Sig Start Date End Date Taking? Authorizing Provider  ?adapalene (DIFFERIN) 0.1 % gel Apply 1 application. topically at bedtime.   Yes [provider]  ?amLODipine (NORVASC) 5 MG tablet Take 5 mg by mouth daily.   Yes [provider]  ?amphetamine-dextroamphetamine (ADDERALL) 20 MG tablet Take 20 mg by mouth 3 (three) times daily.  08/07/15  Yes [provider]  ?cyclobenzaprine (FLEXERIL) 10 MG tablet Take 2 tablets (20 mg total) by mouth at bedtime. 11/29/21  Yes [name], MD  ?doxepin (SINEQUAN) 10 MG capsule Take 30 mg by mouth at bedtime. 09/28/21  Yes [provider]  ?DULoxetine (CYMBALTA) 60 MG capsule Take 120 mg by mouth every evening.   Yes [provider]  ?famotidine (PEPCID) 20 MG tablet Take 20 mg by mouth 2 (two) times daily as needed for heartburn or indigestion.   Yes [provider]  ?FLUoxetine (PROZAC) 20 MG capsule Take 20 mg by mouth every evening. 09/27/21  Yes [provider]  ?gabapentin (NEURONTIN) 600 MG tablet Take 2,400 mg by mouth at bedtime. 08/19/15  Yes [provider]  ?metroNIDAZOLE (METROCREAM) 0.75 % cream Apply 1 application. topically daily. 09/30/21  Yes [provider]  ?fluticasone (FLONASE) 50 MCG/ACT nasal spray Place 2 sprays into both nostrils daily. ?Patient not taking: Reported on 12/01/2021 11/29/21   [name], MD  ? ? ?Current Facility-Administered Medications  ?Medication Dose Route Frequency Provider Last Rate Last Admin  ? 0.9 %  sodium chloride infusion (Manually program via Guardrails IV Fluids)   Intravenous Once Toy Baker, MD      ? acetaminophen (TYLENOL) tablet 650 mg  650 mg Oral Q6H  PRN Toy Baker, MD      ? Or  ? acetaminophen (TYLENOL) suppository 650 mg  650 mg Rectal Q6H PRN Doutova, Anastassia, MD      ? amLODipine (NORVASC) tablet 5 mg  5 mg Oral Daily Doutova, Anastassia, MD   5 mg at 12/02/21 0929  ? DULoxetine (CYMBALTA) DR capsule 120 mg  120 mg Oral QPM Doutova, Anastassia, MD      ? FLUoxetine (PROZAC) capsule 20 mg  20 mg Oral QPM Doutova, Anastassia, MD      ? gabapentin (NEURONTIN) capsule 2,400 mg  2,400 mg Oral QHS Doutova, Anastassia, MD      ? HYDROcodone-acetaminophen (NORCO/VICODIN) 5-325 MG per tablet 1-2 tablet  1-2 tablet Oral Q4H PRN Doutova, Anastassia, MD      ? pantoprazole (PROTONIX) injection 40 mg  40 mg Intravenous Q12H Toy Baker, MD   40 mg at 12/02/21 0929  ? [START ON 12/03/2021] pneumococcal 20-valent conjugate vaccine (PREVNAR 20) injection 0.5 mL  0.5 mL Intramuscular Tomorrow-1000 Toy Baker, MD      ? ? ?Allergies as of 12/01/2021  ? (No Known Allergies)  ? ? ? ?Review of Systems:    ?Constitutional: No weight loss, fever or chills ?Skin: No rash  ?Cardiovascular: No chest pain  ?Respiratory: No SOB  ?Gastrointestinal: See HPI and otherwise negative ?Genitourinary: No dysuria  ?Neurological: No headache, dizziness or  syncope ?Musculoskeletal: No new muscle or joint pain ?Hematologic: No bleeding or bruising ?Psychiatric: No history of depression or anxiety  ? ? Physical Exam:  ?Vital signs in last 24 hours: ?Temp:  [97.5 ?F (36.4

## 2021-12-02 NOTE — Plan of Care (Signed)

## 2021-12-02 NOTE — Discharge Summary (Signed)
Physician Discharge Summary  ?Alicia Rice IOE:[id number] DOB: 08-Jan-1961 DOA: 12/01/2021 ? ?PCP: [name], MD ? ?Admit date: 12/01/2021 ?Discharge date: 12/02/2021 ?Discharging to: home ?Recommendations for Outpatient Follow-up:  ?Please follow up on Iron stores and Vit B12 level ? ?Consults:  ?GI ?  ? ? ?Discharge Diagnoses:  ? Principal Problem: ?  Symptomatic anemia ?Active Problems: ?  Chronic pain ?  GERD (gastroesophageal reflux disease) ?  Hypertension ?  ADHD ?  Alcoholism in remission [facility]) ? ? ? ? ?Hospital Course: ?61 y/o female with h/o GERD and HTN who had labs checked at PCP's office and was found to have anemia and sent to ED.  ? ?In ED Hgb was 5.6. She has no had any GI bleed.  ?Hemoccult in ED was negative.  ?She takes 1 naproxen 2 time a week. She does have GERD and has heartburn. She takes an Omeprazole or a Zantac almost daily. Found to have severely low ferritin (3) and a low B12 (255). Admits to eating poorly for many months due to dental issues and have a diet that is low in nourishment.  ? ?  ? ?Assessment and Plan: ?Principal Problem: ?  Symptomatic anemia, microcytic anemia, Iron deficiency and Vit B12 deficiency ?- see above ?- has been transfused 2 UPRBC ?- I have prescribed BID Iron and daily B12 ?- GI consulted by admitting physician and outpt follow up made to arrange for endoscopies ? ?GERD ?-   she takes Omeprazole and Famotidine ?- takes Naproxen only 2- 3 times a week- I have advised her that it can exacerbate GERD ?  ?Chronic pain ?- she is prescribed Cymbalta and Prozac & Neurontin ? ? ?Discharge Instructions ? ?Discharge Instructions   ? ? Diet - low sodium heart healthy   Complete by: As directed ?  ? Increase activity slowly   Complete by: As directed ?  ? ?  ? ?Allergies as of 12/02/2021   ?No Known Allergies ?  ? ?  ?Medication List  ?  ? ?TAKE these medications   ? ?adapalene 0.1 % gel ?Commonly known as: DIFFERIN ?Apply 1 application. topically at bedtime. ?   ?amLODipine 5 MG tablet ?Commonly known as: NORVASC ?Take 5 mg by mouth daily. ?  ?amphetamine-dextroamphetamine 20 MG tablet ?Commonly known as: ADDERALL ?Take 20 mg by mouth 3 (three) times daily. ?  ?cyclobenzaprine 10 MG tablet ?Commonly known as: FLEXERIL ?Take 2 tablets (20 mg total) by mouth at bedtime. ?  ?doxepin 10 MG capsule ?Commonly known as: SINEQUAN ?Take 30 mg by mouth at bedtime. ?  ?DULoxetine 60 MG capsule ?Commonly known as: CYMBALTA ?Take 120 mg by mouth every evening. ?  ?famotidine 20 MG tablet ?Commonly known as: PEPCID ?Take 20 mg by mouth 2 (two) times daily as needed for heartburn or indigestion. ?  ?ferrous sulfate 325 (65 FE) MG EC tablet ?Take 1 tablet (325 mg total) by mouth 2 (two) times daily. ?  ?FLUoxetine 20 MG capsule ?Commonly known as: PROZAC ?Take 20 mg by mouth every evening. ?  ?fluticasone 50 MCG/ACT nasal spray ?Commonly known as: FLONASE ?Place 2 sprays into both nostrils daily. ?  ?gabapentin 600 MG tablet ?Commonly known as: NEURONTIN ?Take 2,400 mg by mouth at bedtime. ?  ?metroNIDAZOLE 0.75 % cream ?Commonly known as: METROCREAM ?Apply 1 application. topically daily. ?  ?vitamin B-12 1000 MCG tablet ?Commonly known as: CYANOCOBALAMIN ?Take 1 tablet (1,000 mcg total) by mouth daily. ?  ? ?  ? ?  Follow-up Information   ? ? [name], MD Follow up.   ?Specialty: Family Medicine ?Contact information: ?[address] ?[place] [address] ?[phone] ? ? ?  ?  ? ? Mauri Pole, MD Follow up.   ?Specialty: Gastroenterology ?Contact information: ?[address] [phone] ?[phone] ? ? ?  ?  ? ?  ?  ? ?  ? ?  ?  ?The results of significant diagnostics from this hospitalization (including imaging, microbiology, ancillary and laboratory) are listed below for reference.   ? ?US Abdomen Limited RUQ (LIVER/GB) ? ?Result Date: 12/01/2021 ?CLINICAL DATA:  Upper GI bleed. EXAM: ULTRASOUND ABDOMEN LIMITED RIGHT UPPER QUADRANT COMPARISON:   02/23/2012. FINDINGS: Gallbladder: No gallstones or wall thickening visualized. No sonographic Murphy sign noted by sonographer. Common bile duct: Diameter: 6 mm. Liver: No focal lesion identified. Increased parenchymal echogenicity. Portal vein is patent on color Doppler imaging with normal direction of blood flow towards the liver. Other: No free fluid. IMPRESSION: Hepatic steatosis. Electronically Signed   By: Brett Fairy M.D.   On: 12/01/2021 23:21   ?Labs: ?BNP (last 3 results) ?No results for input(s): BNP in the last 8760 hours. ?Basic Metabolic Panel: ?Recent Labs  ?Lab 11/29/21 ?1536 12/01/21 ?1946 12/01/21 ?2137 12/02/21 ?4132  ?NA 141 134*  --  136  ?K 4.7 3.8  --  4.0  ?CL 105 103  --  104  ?CO2 21 24  --  27  ?GLUCOSE 144* 113*  --  91  ?BUN 18 25*  --  19  ?CREATININE 0.56* 0.53  --  0.56  ?CALCIUM 9.2 8.7*  --  8.4*  ?MG  --   --  2.2 2.2  ?PHOS  --   --  3.6 4.1  ? ?Liver Function Tests: ?Recent Labs  ?Lab 11/29/21 ?1536 12/01/21 ?1946 12/02/21 ?4401  ?AST 12 14* 13*  ?ALT '17 18 10  '$ ?ALKPHOS 76 62 59  ?BILITOT 0.2 0.3 0.9  ?PROT 6.5 6.9 7.0  ?ALBUMIN 4.4 3.9 4.0  ? ?No results for input(s): LIPASE, AMYLASE in the last 168 hours. ?No results for input(s): AMMONIA in the last 168 hours. ?CBC: ?Recent Labs  ?Lab 11/29/21 ?1536 12/01/21 ?1946 12/01/21 ?2237 12/02/21 ?0272  ?WBC 7.5 7.9 9.7 6.6  ?NEUTROABS 5.0 4.8  --  3.8  ?HGB 5.2* 5.5* 5.6* 8.2*  ?HCT 19.9* 21.4* 22.6* 28.3*  ?MCV 65* 69.3* 71.1* 73.7*  ?PLT 321 339 329 250  ? ?Cardiac Enzymes: ?Recent Labs  ?Lab 12/01/21 ?2137  ?CKTOTAL 43  ? ?BNP: ?Invalid input(s): POCBNP ?CBG: ?No results for input(s): GLUCAP in the last 168 hours. ?D-Dimer ?No results for input(s): DDIMER in the last 72 hours. ?Hgb A1c ?No results for input(s): HGBA1C in the last 72 hours. ?Lipid Profile ?No results for input(s): CHOL, HDL, LDLCALC, TRIG, CHOLHDL, LDLDIRECT in the last 72 hours. ?Anemia work up ?Recent Labs  ?  12/01/21 ?1946  ?VITAMINB12 255  ?FOLATE 24.1   ?FERRITIN 3*  ?TIBC 542*  ?IRON 46  ?RETICCTPCT 2.8  ? ?Sepsis Labs ?Invalid input(s): PROCALCITONIN,  WBC,  LACTICIDVEN ?Microbiology ?Recent Results (from the past 240 hour(s))  ?Resp Panel by RT-PCR (Flu A&B, Covid) Nasopharyngeal Swab     Status: None  ? Collection Time: 12/01/21  9:38 PM  ? Specimen: Nasopharyngeal Swab; Nasopharyngeal(NP) swabs in vial transport medium  ?Result Value Ref Range Status  ? SARS Coronavirus 2 by RT PCR NEGATIVE NEGATIVE Final  ?  Comment: (NOTE) ?SARS-CoV-2  target nucleic acids are NOT DETECTED. ? ?The SARS-CoV-2 RNA is generally detectable in upper respiratory ?specimens during the acute phase of infection. The lowest ?concentration of SARS-CoV-2 viral copies this assay can detect is ?138 copies/mL. A negative result does not preclude SARS-Cov-2 ?infection and should not be used as the sole basis for treatment or ?other patient management decisions. A negative result may occur with  ?improper specimen collection/handling, submission of specimen other ?than nasopharyngeal swab, presence of viral mutation(s) within the ?areas targeted by this assay, and inadequate number of viral ?copies(<138 copies/mL). A negative result must be combined with ?clinical observations, patient history, and epidemiological ?information. The expected result is Negative. ? ?Fact Sheet for Patients:  ?EntrepreneurPulse.com.au ? ?Fact Sheet for Healthcare Providers:  ?IncredibleEmployment.be ? ?This test is no t yet approved or cleared by the [place] FDA and  ?has been authorized for detection and/or diagnosis of SARS-CoV-2 by ?FDA under an Emergency Use Authorization (EUA). This EUA will remain  ?in effect (meaning this test can be used) for the duration of the ?COVID-19 declaration under Section 564(b)(1) of the Act, 21 ?U.S.C.section 360bbb-3(b)(1), unless the authorization is terminated  ?or revoked sooner.  ? ? ?  ? Influenza A by PCR NEGATIVE NEGATIVE Final   ? Influenza B by PCR NEGATIVE NEGATIVE Final  ?  Comment: (NOTE) ?The Xpert Xpress SARS-CoV-2/FLU/RSV plus assay is intended as an aid ?in the diagnosis of influenza from Nasopharyngeal swab specimens and

## 2021-12-02 NOTE — Discharge Instructions (Signed)
Please eat a balanced diet and add a multivitamin to your regimen. Your Iron and B12 is very low and will need to continue to be replaced until your body stores are replenished and your diet has improved.  ? ?You can follow up with GI if you Iron levels don't improve with replacement, if your GERD is uncontrolled, if you notice blood in stools or if you have other concerns.  ? ?You were cared for by a hospitalist during your hospital stay. If you have any questions about your discharge medications or the care you received while you were in the hospital after you are discharged, you can call the unit and asked to speak with the hospitalist on call if the hospitalist that took care of you is not available. Once you are discharged, your primary care physician will handle any further medical issues.  ? ?Please note that NO REFILLS for any discharge medications will be authorized once you are discharged, as it is imperative that you return to your primary care physician (or establish a relationship with a primary care physician if you do not have one) for your aftercare needs so that they can reassess your need for medications and monitor your lab values. ? ?Please take all your medications with you for your next visit with your Primary MD. ?Please ask your Primary MD to get all Hospital records sent to his/her office. ?Please request your Primary MD to go over all hospital test results at the follow up.  ? ?If you experience worsening of your admission symptoms, develop shortness of breath, chest pain, suicidal or homicidal thoughts or a life threatening emergency, you must seek medical attention immediately by calling 911 or calling your MD. ?  ?You must read the complete instructions/literature along with all the possible adverse reactions/side effects for all the medicines you take including new medications that have been prescribed to you. Take new medicines after you have completely understood and accpet all the  possible adverse reactions/side effects.  ?  ?Do not drive when taking pain medications or sedatives.  ?   ?Do not take more than prescribed Pain, Sleep and Anxiety Medications ?  ?If you have smoked or chewed Tobacco in the last 2 yrs please stop. Stop any regular alcohol  and or recreational drug use. ?  ?Wear Seat belts while driving.  ?

## 2021-12-03 LAB — TYPE AND SCREEN
ABO/RH(D): A POS
Antibody Screen: NEGATIVE
Unit division: 0
Unit division: 0

## 2021-12-03 LAB — BPAM RBC
Blood Product Expiration Date: 202303262359
Blood Product Expiration Date: 202304052359
ISSUE DATE / TIME: 202303152310
ISSUE DATE / TIME: 202303160200
Unit Type and Rh: 600
Unit Type and Rh: 6200

## 2021-12-03 NOTE — Progress Notes (Signed)
? ?I,Elena D DeSanto,acting as a scribe for [name], MD.,have documented all relevant documentation on the behalf of [name], MD,as directed by  [name], MD while in the presence of [name], MD. ? ? ? ?Established patient visit ? ? ?Patient: Alicia Rice   DOB: 05-27-1961   61 y.o. Female  MRN: [id number] ?Visit Date: 12/06/2021 ? ?Today's healthcare provider: [name], MD  ? ?No chief complaint on file. ? ?Subjective  ?  ?HPI  ?Patient comes in today for follow-up after hospitalization for for symptomatic anemia.  He had a hemoglobin of 5.2 and was admitted and transfused 2 units of packed cells and has endoscopy and colonoscopy scheduled for 4 April.  She feels a little bit stronger with her last hemoglobin I think being 8.2.  She is having no chest pain or shortness of breath.  No vaginal bleeding.  No fever or chills or any systemic symptoms otherwise ?Follow up Hospitalization ? ?Patient was admitted to [facility] on 12/01/2021 and discharged on 12/02/2021. ?She was treated for Symptomatic anemia. ?Treatment for this included transfused 2 UPRBC ? prescribed BID Iron and daily B12. Outpatient GI follow up made to arrange for endoscopies on April 4 ? ? ?----------------------------------------------------------------------------------------- -  ? ?Medications: ?Outpatient Medications Prior to Visit  ?Medication Sig  ? adapalene (DIFFERIN) 0.1 % gel Apply 1 application. topically at bedtime.  ? amLODipine (NORVASC) 5 MG tablet Take 5 mg by mouth daily.  ? amphetamine-dextroamphetamine (ADDERALL) 20 MG tablet Take 20 mg by mouth 3 (three) times daily.   ? cyclobenzaprine (FLEXERIL) 10 MG tablet Take 2 tablets (20 mg total) by mouth at bedtime.  ? doxepin (SINEQUAN) 10 MG capsule Take 30 mg by mouth at bedtime.  ? DULoxetine (CYMBALTA) 60 MG capsule Take 120 mg by mouth every evening.  ? famotidine (PEPCID) 20 MG tablet Take 20 mg by mouth 2 (two) times daily  as needed for heartburn or indigestion.  ? ferrous sulfate 325 (65 FE) MG EC tablet Take 1 tablet (325 mg total) by mouth 2 (two) times daily.  ? FLUoxetine (PROZAC) 20 MG capsule Take 20 mg by mouth every evening.  ? fluticasone (FLONASE) 50 MCG/ACT nasal spray Place 2 sprays into both nostrils daily.  ? gabapentin (NEURONTIN) 600 MG tablet Take 2,400 mg by mouth at bedtime.  ? metroNIDAZOLE (METROCREAM) 0.75 % cream Apply 1 application. topically daily.  ? vitamin B-12 (CYANOCOBALAMIN) 1000 MCG tablet Take 1 tablet (1,000 mcg total) by mouth daily.  ? ?No facility-administered medications prior to visit.  ? ? ?Review of Systems  ?Gastrointestinal:  Negative for abdominal pain, blood in stool, constipation, diarrhea, nausea and vomiting.  ?Hematological:  Does not bruise/bleed easily.  ? ?Last CBC ?Lab Results  ?Component Value Date  ? WBC 10.3 12/06/2021  ? HGB 9.8 (L) 12/06/2021  ? HCT 33.2 (L) 12/06/2021  ? MCV 72 (L) 12/06/2021  ? MCH 21.3 (L) 12/06/2021  ? RDW 26.4 (H) 12/06/2021  ? PLT 311 12/06/2021  ? ?  ?  Objective  ?  ?BP (!) 150/89 (BP Location: Right Arm, Patient Position: Sitting, Cuff Size: Normal)   Pulse 79   Temp 98.6 ?F (37 ?C) (Oral)   Wt 165 lb (74.8 kg)   SpO2 100%   BMI 27.46 kg/m?  ?BP Readings from Last 3 Encounters:  ?12/06/21 (!) 150/89  ?12/02/21 (!) 142/77  ?11/29/21 (!) 156/68  ? ?Wt Readings from Last 3 Encounters:  ?  12/06/21 165 lb (74.8 kg)  ?12/01/21 169 lb 15.6 oz (77.1 kg)  ?11/29/21 170 lb (77.1 kg)  ? ?  ? ?Physical Exam ?Vitals reviewed.  ?HENT:  ?   Head: Normocephalic and atraumatic.  ?Eyes:  ?   General: No scleral icterus. ?Cardiovascular:  ?   Rate and Rhythm: Normal rate and regular rhythm.  ?   Heart sounds: Normal heart sounds.  ?Pulmonary:  ?   Breath sounds: Normal breath sounds.  ?Abdominal:  ?   Palpations: Abdomen is soft.  ?Skin: ?   General: Skin is warm and dry.  ?   Comments: Patient has a mask on but has a couple of excoriations of her face.   ?Neurological:  ?   Mental Status: She is alert and oriented to person, place, and time.  ?Psychiatric:     ?   Mood and Affect: Mood normal.     ?   Behavior: Behavior normal.  ?  ? ? ?No results found for any visits on 12/06/21. ? Assessment & Plan  ?  ? ?1. Symptomatic anemia ?Patient on twice daily iron.  If her blood count is rising we will cut back on the iron to once a day to avoid side effects.  Consult and endoscopies next week.  We will not need to see hematology for body responds to p.o. iron. ?- CBC w/Diff/Platelet ?- Iron, TIBC and Ferritin Panel ? ?2. Microcytic anemia ?As above. ?- CBC w/Diff/Platelet ?- Iron, TIBC and Ferritin Panel ? ?3. Primary hypertension ?Add telmisartan 20 mg daily.  I will see her back in a couple months. ?- Comprehensive Metabolic Panel (CMET) ? ?4. Major depressive disorder in partial remission, unspecified whether recurrent ([place]) ?Followed by psychiatry. ? ?5. Abnormal liver enzymes ?Probable fatty liver.  Patient has been alcohol sober for 10 years by her account ?- Comprehensive Metabolic Panel (CMET) ? ?6. Prediabetes ?Work on diet and exercise. ?- HgB A1c ? ? ?No follow-ups on file.  ?   ? ?I, [name], MD, have reviewed all documentation for this visit. The documentation on 12/07/21 for the exam, diagnosis, procedures, and orders are all accurate and complete. ? ? ? ?Cleora Karnik Cranford Mon, MD  ?[facility] ?[phone] (phone) ?[phone] (fax) ? ?[facility]  ?

## 2021-12-06 ENCOUNTER — Other Ambulatory Visit: Payer: Self-pay

## 2021-12-06 ENCOUNTER — Ambulatory Visit (INDEPENDENT_AMBULATORY_CARE_PROVIDER_SITE_OTHER): Payer: No Typology Code available for payment source | Admitting: Family Medicine

## 2021-12-06 VITALS — BP 150/89 | HR 79 | Temp 98.6°F | Wt 165.0 lb

## 2021-12-06 DIAGNOSIS — D649 Anemia, unspecified: Secondary | ICD-10-CM

## 2021-12-06 DIAGNOSIS — I10 Essential (primary) hypertension: Secondary | ICD-10-CM

## 2021-12-06 DIAGNOSIS — F324 Major depressive disorder, single episode, in partial remission: Secondary | ICD-10-CM | POA: Diagnosis not present

## 2021-12-06 DIAGNOSIS — D509 Iron deficiency anemia, unspecified: Secondary | ICD-10-CM | POA: Diagnosis not present

## 2021-12-06 DIAGNOSIS — R7303 Prediabetes: Secondary | ICD-10-CM

## 2021-12-06 DIAGNOSIS — R748 Abnormal levels of other serum enzymes: Secondary | ICD-10-CM

## 2021-12-06 MED ORDER — TELMISARTAN 20 MG PO TABS: 20.0000 mg | ORAL_TABLET | Freq: Every day | ORAL | 3 refills | Status: AC

## 2021-12-06 MED ORDER — AMLODIPINE BESYLATE 5 MG PO TABS: 5.0000 mg | ORAL_TABLET | Freq: Every day | ORAL | 3 refills | Status: AC

## 2021-12-07 LAB — CBC WITH DIFFERENTIAL/PLATELET
Basophils Absolute: 0.2 10*3/uL (ref 0.0–0.2)
Basos: 2 %
EOS (ABSOLUTE): 0.4 10*3/uL (ref 0.0–0.4)
Eos: 4 %
Hematocrit: 33.2 % — ABNORMAL LOW (ref 34.0–46.6)
Hemoglobin: 9.8 g/dL — ABNORMAL LOW (ref 11.1–15.9)
Immature Grans (Abs): 0 10*3/uL (ref 0.0–0.1)
Immature Granulocytes: 0 %
Lymphocytes Absolute: 2.6 10*3/uL (ref 0.7–3.1)
Lymphs: 25 %
MCH: 21.3 pg — ABNORMAL LOW (ref 26.6–33.0)
MCHC: 29.5 g/dL — ABNORMAL LOW (ref 31.5–35.7)
MCV: 72 fL — ABNORMAL LOW (ref 79–97)
Monocytes Absolute: 0.8 10*3/uL (ref 0.1–0.9)
Monocytes: 7 %
Neutrophils Absolute: 6.4 10*3/uL (ref 1.4–7.0)
Neutrophils: 62 %
Platelets: 311 10*3/uL (ref 150–450)
RBC: 4.6 x10E6/uL (ref 3.77–5.28)
RDW: 26.4 % — ABNORMAL HIGH (ref 11.7–15.4)
WBC: 10.3 10*3/uL (ref 3.4–10.8)

## 2021-12-07 LAB — IRON,TIBC AND FERRITIN PANEL
Ferritin: 9 ng/mL — ABNORMAL LOW (ref 15–150)
Iron Saturation: 6 % — CL (ref 15–55)
Iron: 32 ug/dL (ref 27–159)
Total Iron Binding Capacity: 512 ug/dL — ABNORMAL HIGH (ref 250–450)
UIBC: 480 ug/dL — ABNORMAL HIGH (ref 131–425)

## 2021-12-07 LAB — COMPREHENSIVE METABOLIC PANEL
ALT: 25 IU/L (ref 0–32)
AST: 16 IU/L (ref 0–40)
Albumin/Globulin Ratio: 2.4 — ABNORMAL HIGH (ref 1.2–2.2)
Albumin: 5 g/dL — ABNORMAL HIGH (ref 3.8–4.9)
Alkaline Phosphatase: 93 IU/L (ref 44–121)
BUN/Creatinine Ratio: 28 (ref 12–28)
BUN: 19 mg/dL (ref 8–27)
Bilirubin Total: 0.4 mg/dL (ref 0.0–1.2)
CO2: 25 mmol/L (ref 20–29)
Calcium: 9.9 mg/dL (ref 8.7–10.3)
Chloride: 100 mmol/L (ref 96–106)
Creatinine, Ser: 0.68 mg/dL (ref 0.57–1.00)
Globulin, Total: 2.1 g/dL (ref 1.5–4.5)
Glucose: 89 mg/dL (ref 70–99)
Potassium: 4.7 mmol/L (ref 3.5–5.2)
Sodium: 138 mmol/L (ref 134–144)
Total Protein: 7.1 g/dL (ref 6.0–8.5)
eGFR: 100 mL/min/{1.73_m2} (ref >59)

## 2021-12-07 LAB — HEMOGLOBIN A1C
Est. average glucose Bld gHb Est-mCnc: 111 mg/dL
Hgb A1c MFr Bld: 5.5 % (ref 4.8–5.6)

## 2021-12-20 ENCOUNTER — Telehealth: Payer: Self-pay

## 2021-12-20 NOTE — Telephone Encounter (Signed)
Spoke with patient to remind her that she is due for repeat labs at this time. No appointment is necessary. Patient is aware that she can stop by the lab in the basement at her convenience today before 5 PM, or she can come in about an hour prior to her appt tomorrow. Patient verbalized understanding and had no concerns at the end of the call.  ? ?

## 2021-12-20 NOTE — Telephone Encounter (Signed)
-----   Message from Yevette Edwards, RN sent at 12/02/2021 12:51 PM EDT ----- ?Regarding: Labs ?CBC prior to appt tomorrow  ?Order is in epic ? ?

## 2021-12-21 ENCOUNTER — Encounter: Payer: Self-pay | Admitting: Physician Assistant

## 2021-12-21 ENCOUNTER — Ambulatory Visit (INDEPENDENT_AMBULATORY_CARE_PROVIDER_SITE_OTHER): Payer: No Typology Code available for payment source | Admitting: Physician Assistant

## 2021-12-21 ENCOUNTER — Other Ambulatory Visit (INDEPENDENT_AMBULATORY_CARE_PROVIDER_SITE_OTHER): Payer: No Typology Code available for payment source

## 2021-12-21 VITALS — BP 120/84 | HR 94 | Ht 65.0 in | Wt 164.0 lb

## 2021-12-21 DIAGNOSIS — D509 Iron deficiency anemia, unspecified: Secondary | ICD-10-CM | POA: Diagnosis not present

## 2021-12-21 DIAGNOSIS — D649 Anemia, unspecified: Secondary | ICD-10-CM

## 2021-12-21 LAB — CBC
HCT: 34.9 % — ABNORMAL LOW (ref 36.0–46.0)
Hemoglobin: 10.9 g/dL — ABNORMAL LOW (ref 12.0–15.0)
MCHC: 31.2 g/dL (ref 30.0–36.0)
MCV: 75 fl — ABNORMAL LOW (ref 78.0–100.0)
Platelets: 607 10*3/uL — ABNORMAL HIGH (ref 150.0–400.0)
RBC: 4.66 Mil/uL (ref 3.87–5.11)
RDW: 32.2 % — ABNORMAL HIGH (ref 11.5–15.5)
WBC: 7.5 10*3/uL (ref 4.0–10.5)

## 2021-12-21 MED ORDER — PLENVU 140 G PO SOLR: 1.0000 | Freq: Once | ORAL | 0 refills | Status: AC

## 2021-12-21 NOTE — Progress Notes (Signed)
Reviewed and agree with documentation and assessment and plan. K. Veena Tres Grzywacz , MD   

## 2021-12-21 NOTE — Patient Instructions (Signed)
If you are age 61 or older, your body mass index should be between 23-30. Your Body mass index is 27.29 kg/m?Marland Kitchen If this is out of the aforementioned range listed, please consider follow up with your Primary Care Provider. ? ?If you are age 25 or younger, your body mass index should be between 19-25. Your Body mass index is 27.29 kg/m?Marland Kitchen If this is out of the aformentioned range listed, please consider follow up with your Primary Care Provider.  ? ?________________________________________________________ ? ?The [facility] providers would like to encourage you to use [facility] to communicate with providers for non-urgent requests or questions.  Due to long hold times on the telephone, sending your provider a message by [facility] may be a faster and more efficient way to get a response.  Please allow 48 business hours for a response.  Please remember that this is for non-urgent requests.  ?_______________________________________________________ ? ?You have been scheduled for an endoscopy and colonoscopy. Please follow the written instructions given to you at your visit today. ?Please pick up your prep supplies at the pharmacy within the next 1-3 days. ?If you use inhalers (even only as needed), please bring them with you on the day of your procedure. ? ?Continue twice a day iron ?

## 2021-12-21 NOTE — Progress Notes (Signed)
? ?Chief Complaint: Follow-up hospitalization for anemia ? ?HPI: ?   Alicia Rice is a 62 year old female with a past medical history as listed below including GERD, alcohol abuse, seizures and hypertension, who presented to the hospital on 12/02/2021 with anemia and who presents clinic today for follow-up. ?   12/02/2021 patient seen at the hospital for anemia.  Presented to the emergency department with a hemoglobin of 4.2.  In the ER hemoglobin 5.5, fecal occult negative.  She was given 2 units PRBCs.  At time discussed that at some point she would need an EGD and colonoscopy.  Patient left the hospital after blood transfusions.  She was scheduled for an EGD and colonoscopy 01/05/2022 with Dr. Silverio Decamp. ?   12/06/2021 patient had a hemoglobin which was 9.8 (8.2 on 3/16), MCV low at 72.  CMP normal.  Iron studies improved with a percent saturation still low at 6% and a ferritin low at 9.  She was told she could cut back to once daily iron dosing by her PCP. ?   Today, patient tells me that she is feeling okay, she is hopeful that we will figure out what made her anemic and that getting her iron stores and hemoglobin back up to normal make her feel slightly more energetic.  Tells me that she was just decreased to iron once daily by her PCP because he was worried about side effects that may occur in the future.  Otherwise she was tolerating this well. ?   Denies fever, chills, weight loss or blood in her stool. ? ?Past Medical History:  ?Diagnosis Date  ? Acute carpal tunnel syndrome of left wrist 07/07/2017  ? Arthritis   ? lt knee  ? Chronic back pain   ? Closed fracture of left distal radius 07/07/2017  ? Depression   ? Distal radius fracture, left   ? GERD (gastroesophageal reflux disease)   ? Hypertension   ? Loose, teeth   ? bottom front  ? PONV (postoperative nausea and vomiting)   ? ? ?Past Surgical History:  ?Procedure Laterality Date  ? CARPAL TUNNEL RELEASE Left 07/07/2017  ? Procedure: LEFT CARPAL TUNNEL  RELEASE;  Surgeon: Marchia Bond, MD;  Location: [place];  Service: Orthopedics;  Laterality: Left;  ? DILATION AND CURETTAGE OF UTERUS    ? ELBOW SURGERY    ? nerve surgery  ? FOOT SURGERY    ? 2011 & fusion of bones 2016  ? HYSTEROSCOPY    ? MANDIBLE FRACTURE SURGERY    ? NASAL SINUS SURGERY    ? OPEN REDUCTION INTERNAL FIXATION (ORIF) DISTAL RADIAL FRACTURE Left 07/07/2017  ? Procedure: OPEN REDUCTION INTERNAL FIXATION (ORIF) LEFT DISTAL RADIAL FRACTURE;  Surgeon: Marchia Bond, MD;  Location: [place];  Service: Orthopedics;  Laterality: Left;  ? PARTIAL KNEE ARTHROPLASTY Left 11/28/2017  ? Procedure: UNICOMPARTMENTAL KNEE;  Surgeon: Marchia Bond, MD;  Location: [place];  Service: Orthopedics;  Laterality: Left;  ? TEMPOROMANDIBULAR JOINT SURGERY    ? TONSILLECTOMY    ? ? ?Current Outpatient Medications  ?Medication Sig Dispense Refill  ? adapalene (DIFFERIN) 0.1 % gel Apply 1 application. topically at bedtime.    ? amLODipine (NORVASC) 5 MG tablet Take 1 tablet (5 mg total) by mouth daily. 90 tablet 3  ? amphetamine-dextroamphetamine (ADDERALL) 20 MG tablet Take 20 mg by mouth 3 (three) times daily.     ? cyclobenzaprine (FLEXERIL) 10 MG tablet Take 2 tablets (20 mg total) by mouth  at bedtime. 60 tablet 5  ? doxepin (SINEQUAN) 10 MG capsule Take 30 mg by mouth at bedtime.    ? DULoxetine (CYMBALTA) 60 MG capsule Take 120 mg by mouth every evening.    ? famotidine (PEPCID) 20 MG tablet Take 20 mg by mouth 2 (two) times daily as needed for heartburn or indigestion.    ? ferrous sulfate 325 (65 FE) MG EC tablet Take 1 tablet (325 mg total) by mouth 2 (two) times daily. 60 tablet 3  ? FLUoxetine (PROZAC) 20 MG capsule Take 20 mg by mouth every evening.    ? fluticasone (FLONASE) 50 MCG/ACT nasal spray Place 2 sprays into both nostrils daily. 16 g 11  ? gabapentin (NEURONTIN) 600 MG tablet Take 2,400 mg by mouth at bedtime.    ? metroNIDAZOLE (METROCREAM) 0.75 % cream Apply 1  application. topically daily.    ? telmisartan (MICARDIS) 20 MG tablet Take 1 tablet (20 mg total) by mouth daily. 90 tablet 3  ? vitamin B-12 (CYANOCOBALAMIN) 1000 MCG tablet Take 1 tablet (1,000 mcg total) by mouth daily. 30 tablet 0  ? ?No current facility-administered medications for this visit.  ? ? ?Allergies as of 12/21/2021  ? (No Known Allergies)  ? ? ?Family History  ?Problem Relation Age of Onset  ? Heart murmur Mother   ? Hypertension Father   ? Diabetes Father   ? Arthritis Father   ? Prostate cancer Father   ? Graves' disease Father   ? ? ?Social History  ? ?Socioeconomic History  ? Marital status: Married  ?  Spouse name: Not on file  ? Number of children: Not on file  ? Years of education: Not on file  ? Highest education level: Not on file  ?Occupational History  ? Not on file  ?Tobacco Use  ? Smoking status: Former  ?  Packs/day: 0.25  ?  Types: Cigarettes  ?  Quit date: 07/03/2017  ?  Years since quitting: 4.4  ? Smokeless tobacco: Never  ?Vaping Use  ? Vaping Use: Never used  ?Substance and Sexual Activity  ? Alcohol use: No  ? Drug use: No  ? Sexual activity: Not on file  ?Other Topics Concern  ? Not on file  ?Social History Narrative  ? Not on file  ? ?Social Determinants of Health  ? ?Financial Resource Strain: Not on file  ?Food Insecurity: Not on file  ?Transportation Needs: Not on file  ?Physical Activity: Not on file  ?Stress: Not on file  ?Social Connections: Not on file  ?Intimate Partner Violence: Not on file  ? ? ?Review of Systems:    ?Constitutional: No weight loss, fever or chills ?Cardiovascular: No chest pain ?Respiratory: No SOB  ?Gastrointestinal: See HPI and otherwise negative ? ? Physical Exam:  ?Vital signs: ?BP 120/84   Pulse 94   Ht '5\' 5"'$  (1.651 m)   Wt 164 lb (74.4 kg)   BMI 27.29 kg/m?   ? ?Constitutional:   Pleasant Caucasian female appears to be in NAD, Well developed, Well nourished, alert and cooperative ?Respiratory: Respirations even and unlabored. Lungs clear  to auscultation bilaterally.   No wheezes, crackles, or rhonchi.  ?Cardiovascular: Normal S1, S2. No MRG. Regular rate and rhythm. No peripheral edema, cyanosis or pallor.  ?Gastrointestinal:  Soft, nondistended, nontender. No rebound or guarding. Normal bowel sounds. No appreciable masses or hepatomegaly. ?Rectal:  Not performed.  ?Psychiatric:  Demonstrates good judgement and reason without abnormal affect or behaviors. ? ?RELEVANT LABS  AND IMAGING: ?CBC ?   ?Component Value Date/Time  ? WBC 10.3 12/06/2021 0902  ? WBC 6.6 12/02/2021 0616  ? RBC 4.60 12/06/2021 0902  ? RBC 3.84 (L) 12/02/2021 9702  ? HGB 9.8 (L) 12/06/2021 0902  ? HCT 33.2 (L) 12/06/2021 0902  ? PLT 311 12/06/2021 0902  ? MCV 72 (L) 12/06/2021 0902  ? MCH 21.3 (L) 12/06/2021 0902  ? MCH 21.4 (L) 12/02/2021 6378  ? MCHC 29.5 (L) 12/06/2021 0902  ? MCHC 29.0 (L) 12/02/2021 5885  ? RDW 26.4 (H) 12/06/2021 0902  ? LYMPHSABS 2.6 12/06/2021 0902  ? MONOABS 0.6 12/02/2021 0616  ? EOSABS 0.4 12/06/2021 0902  ? BASOSABS 0.2 12/06/2021 0902  ? ? ?CMP  ?   ?Component Value Date/Time  ? NA 138 12/06/2021 0902  ? K 4.7 12/06/2021 0902  ? CL 100 12/06/2021 0902  ? CO2 25 12/06/2021 0902  ? GLUCOSE 89 12/06/2021 0902  ? GLUCOSE 91 12/02/2021 0616  ? BUN 19 12/06/2021 0902  ? CREATININE 0.68 12/06/2021 0902  ? CALCIUM 9.9 12/06/2021 0902  ? PROT 7.1 12/06/2021 0902  ? ALBUMIN 5.0 (H) 12/06/2021 0902  ? AST 16 12/06/2021 0902  ? ALT 25 12/06/2021 0902  ? ALKPHOS 93 12/06/2021 0902  ? BILITOT 0.4 12/06/2021 0902  ? GFRNONAA >60 12/02/2021 0616  ? GFRAA >60 11/29/2017 0615  ? ? ?Assessment: ?1.  Iron deficiency anemia: Recent hospitalization with a hemoglobin as low as 4.2--> 3 units PRBCs--> 9.8; EGD and colonoscopy recommended for evaluation of GI source of anemia ? ?Plan: ?1.  CBC was repeated just prior to patient's visit.  We will get results of that later today. ?2.  Encouraged the patient to continue her iron twice daily if she was tolerating this well given  that her iron stores were still quite low.  If she starts to have some side effects then she knows she can back down to once daily dosing. ?3.  Patient is already scheduled for EGD and colonoscopy with Dr. Silverio Decamp

## 2021-12-28 ENCOUNTER — Encounter: Payer: Self-pay | Admitting: Gastroenterology

## 2022-01-05 ENCOUNTER — Encounter: Payer: Self-pay | Admitting: Gastroenterology

## 2022-01-05 ENCOUNTER — Ambulatory Visit (AMBULATORY_SURGERY_CENTER): Payer: No Typology Code available for payment source | Admitting: Gastroenterology

## 2022-01-05 VITALS — BP 130/65 | HR 71 | Temp 98.6°F | Resp 10 | Ht 65.0 in | Wt 164.0 lb

## 2022-01-05 DIAGNOSIS — K5730 Diverticulosis of large intestine without perforation or abscess without bleeding: Secondary | ICD-10-CM | POA: Diagnosis not present

## 2022-01-05 DIAGNOSIS — K449 Diaphragmatic hernia without obstruction or gangrene: Secondary | ICD-10-CM | POA: Diagnosis not present

## 2022-01-05 DIAGNOSIS — D509 Iron deficiency anemia, unspecified: Secondary | ICD-10-CM

## 2022-01-05 DIAGNOSIS — K635 Polyp of colon: Secondary | ICD-10-CM

## 2022-01-05 DIAGNOSIS — D125 Benign neoplasm of sigmoid colon: Secondary | ICD-10-CM | POA: Diagnosis not present

## 2022-01-05 DIAGNOSIS — K648 Other hemorrhoids: Secondary | ICD-10-CM

## 2022-01-05 MED ORDER — SODIUM CHLORIDE 0.9 % IV SOLN: 500.0000 mL | Freq: Once | INTRAVENOUS | Status: DC

## 2022-01-05 NOTE — Progress Notes (Signed)
Please refer to office visit note 12/20/21 by Ellouise Newer. No additional changes in H&P ?Patient is appropriate for planned procedure(s) and anesthesia in an ambulatory setting ? ?K. Denzil Magnuson , MD ?[phone]   ?

## 2022-01-05 NOTE — Op Note (Signed)
[facility] ?Patient Name: Alicia Rice ?Procedure Date: 01/05/2022 3:01 PM ?MRN: [id number] ?Endoscopist: Mauri Pole , MD ?Age: 61 ?Referring MD:  ?Date of Birth: [birth year] ?Gender: Female ?Account #: [id number] ?Procedure:                Upper GI endoscopy ?Indications:              Suspected upper gastrointestinal bleeding in  ?                          patient with unexplained iron deficiency anemia ?Medicines:                Monitored Anesthesia Care ?Procedure:                Pre-Anesthesia Assessment: ?                          - Prior to the procedure, a History and Physical  ?                          was performed, and patient medications and  ?                          allergies were reviewed. The patient's tolerance of  ?                          previous anesthesia was also reviewed. The risks  ?                          and benefits of the procedure and the sedation  ?                          options and risks were discussed with the patient.  ?                          All questions were answered, and informed consent  ?                          was obtained. Prior Anticoagulants: The patient has  ?                          taken no previous anticoagulant or antiplatelet  ?                          agents. ASA Grade Assessment: II - A patient with  ?                          mild systemic disease. After reviewing the risks  ?                          and benefits, the patient was deemed in  ?                          satisfactory condition to undergo the procedure. ?  After obtaining informed consent, the endoscope was  ?                          passed under direct vision. Throughout the  ?                          procedure, the patient's blood pressure, pulse, and  ?                          oxygen saturations were monitored continuously. The  ?                          Endoscope was introduced through the mouth, and  ?                          advanced to  the second part of duodenum. The upper  ?                          GI endoscopy was accomplished without difficulty.  ?                          The patient tolerated the procedure well. ?Scope In: ?Scope Out: ?Findings:                 The Z-line was regular and was found 36 cm from the  ?                          incisors. ?                          No gross lesions were noted in the entire esophagus. ?                          A small hiatal hernia was present. ?                          The exam of the stomach was otherwise normal. ?                          The cardia and gastric fundus were normal on  ?                          retroflexion. ?                          The examined duodenum was normal. ?Complications:            No immediate complications. ?Estimated Blood Loss:     Estimated blood loss was minimal. ?Impression:               - Z-line regular, 36 cm from the incisors. ?                          - No gross lesions in esophagus. ?                          -  Small hiatal hernia. ?                          - Normal examined duodenum. ?                          - No specimens collected. ?Recommendation:           - Patient has a contact number available for  ?                          emergencies. The signs and symptoms of potential  ?                          delayed complications were discussed with the  ?                          patient. Return to normal activities tomorrow.  ?                          Written discharge instructions were provided to the  ?                          patient. ?                          - Resume previous diet. ?                          - Continue present medications. ?                          - No aspirin, ibuprofen, naproxen, or other  ?                          non-steroidal anti-inflammatory drugs. ?                          - Follow an antireflux regimen. ?                          - Continue oral iron and B12 daily ?                          - Schedule small  bowel video capsule study for  ?                          further evaluation of iron deficiency anemia at  ?                          next available appointment ?                          - Follow up in GI office in 2-3 months after pill  ?                          camera study ?Mauri Pole, MD ?  01/05/2022 3:36:06 PM ?This report has been signed electronically. ?

## 2022-01-05 NOTE — Progress Notes (Signed)
Called to room to assist during endoscopic procedure.  Patient ID and intended procedure confirmed with present staff. Received instructions for my participation in the procedure from the performing physician.  

## 2022-01-05 NOTE — Progress Notes (Signed)
Pt's states no medical or surgical changes since previsit or office visit. 

## 2022-01-05 NOTE — Progress Notes (Signed)
Sedate, gd SR, tolerated procedure well, VSS, report to RN 

## 2022-01-05 NOTE — Patient Instructions (Signed)
Read all handouts given to you today ?NO ASPIRIN, ASPIRIN CONTAINING PRODUCTS (BC OR GOODY POWDERS) OR NSAIDS (IBUPROFEN, ADVIL, ALEVE, AND MOTRIN); TYLENOL IS OK TO TAKE  ? ?YOU HAD AN ENDOSCOPIC PROCEDURE TODAY AT [facility]:   Refer to the procedure report that was given to you for any specific questions about what was found during the examination.  If the procedure report does not answer your questions, please call your gastroenterologist to clarify.  If you requested that your care partner not be given the details of your procedure findings, then the procedure report has been included in a sealed envelope for you to review at your convenience later. ? ?YOU SHOULD EXPECT: Some feelings of bloating in the abdomen. Passage of more gas than usual.  Walking can help get rid of the air that was put into your GI tract during the procedure and reduce the bloating. If you had a lower endoscopy (such as a colonoscopy or flexible sigmoidoscopy) you may notice spotting of blood in your stool or on the toilet paper. If you underwent a bowel prep for your procedure, you may not have a normal bowel movement for a few days. ? ?Please Note:  You might notice some irritation and congestion in your nose or some drainage.  This is from the oxygen used during your procedure.  There is no need for concern and it should clear up in a day or so. ? ?SYMPTOMS TO REPORT IMMEDIATELY: ? ?Following lower endoscopy (colonoscopy or flexible sigmoidoscopy): ? Excessive amounts of blood in the stool ? Significant tenderness or worsening of abdominal pains ? Swelling of the abdomen that is new, acute ? Fever of 100?F or higher ? ?Following upper endoscopy (EGD) ? Vomiting of blood or coffee ground material ? New chest pain or pain under the shoulder blades ? Painful or persistently difficult swallowing ? New shortness of breath ? Fever of 100?F or higher ? Black, tarry-looking stools ? ?For urgent or emergent issues, a  gastroenterologist can be reached at any hour by calling [phone]. ?Do not use MyChart messaging for urgent concerns.  ? ? ?DIET:  We do recommend a small meal at first, but then you may proceed to your regular diet.  Drink plenty of fluids but you should avoid alcoholic beverages for 24 hours. ? ?ACTIVITY:  You should plan to take it easy for the rest of today and you should NOT DRIVE or use heavy machinery until tomorrow (because of the sedation medicines used during the test).   ? ?FOLLOW UP: ?Our staff will call the number listed on your records 48-72 hours following your procedure to check on you and address any questions or concerns that you may have regarding the information given to you following your procedure. If we do not reach you, we will leave a message.  We will attempt to reach you two times.  During this call, we will ask if you have developed any symptoms of COVID 19. If you develop any symptoms (ie: fever, flu-like symptoms, shortness of breath, cough etc.) before then, please call [phone].  If you test positive for Covid 19 in the 2 weeks post procedure, please call and report this information to [place].   ? ?If any biopsies were taken you will be contacted by phone or by letter within the next 1-3 weeks.  Please call us at [phone] if you have not heard about the biopsies in 3 weeks.  ? ? ?SIGNATURES/CONFIDENTIALITY: ?You and/or  your care partner have signed paperwork which will be entered into your electronic medical record.  These signatures attest to the fact that that the information above on your After Visit Summary has been reviewed and is understood.  Full responsibility of the confidentiality of this discharge information lies with you and/or your care-partner.  ?

## 2022-01-05 NOTE — Op Note (Signed)
[place] ?Patient Name: Alicia Rice ?Procedure Date: 01/05/2022 2:51 PM ?MRN: [id number] ?Endoscopist: Mauri Pole , MD ?Age: 61 ?Referring MD:  ?Date of Birth: Sep 27, 1960 ?Gender: Female ?Account #: [id number] ?Procedure:                Colonoscopy ?Indications:              Unexplained iron deficiency anemia ?Medicines:                Monitored Anesthesia Care ?Procedure:                Pre-Anesthesia Assessment: ?                          - Prior to the procedure, a History and Physical  ?                          was performed, and patient medications and  ?                          allergies were reviewed. The patient's tolerance of  ?                          previous anesthesia was also reviewed. The risks  ?                          and benefits of the procedure and the sedation  ?                          options and risks were discussed with the patient.  ?                          All questions were answered, and informed consent  ?                          was obtained. Prior Anticoagulants: The patient has  ?                          taken no previous anticoagulant or antiplatelet  ?                          agents. ASA Grade Assessment: II - A patient with  ?                          mild systemic disease. After reviewing the risks  ?                          and benefits, the patient was deemed in  ?                          satisfactory condition to undergo the procedure. ?                          After obtaining informed consent, the colonoscope  ?  was passed under direct vision. Throughout the  ?                          procedure, the patient's blood pressure, pulse, and  ?                          oxygen saturations were monitored continuously. The  ?                          Olympus [id number]-[id number] (#[id number]) Colonoscope was  ?                          introduced through the anus and advanced to the the  ?                          cecum, identified by  appendiceal orifice and  ?                          ileocecal valve. The colonoscopy was performed  ?                          without difficulty. The patient tolerated the  ?                          procedure well. The quality of the bowel  ?                          preparation was good. The ileocecal valve,  ?                          appendiceal orifice, and rectum were photographed. ?Scope In: 3:13:06 PM ?Scope Out: 3:29:30 PM ?Scope Withdrawal Time: 0 hours 9 minutes 19 seconds  ?Total Procedure Duration: 0 hours 16 minutes 24 seconds  ?Findings:                 The perianal and digital rectal examinations were  ?                          normal. ?                          A 10 mm polyp was found in the sigmoid colon. The  ?                          polyp was semi-pedunculated. The polyp was removed  ?                          with a hot snare. Resection and retrieval were  ?                          complete. ?                          A 8 mm polyp was found in the sigmoid colon. The  ?  polyp was sessile. The polyp was removed with a  ?                          cold snare. Resection and retrieval were complete. ?                          A few small-mouthed diverticula were found in the  ?                          sigmoid colon. ?                          Non-bleeding external and internal hemorrhoids were  ?                          found during retroflexion. The hemorrhoids were  ?                          medium-sized. ?                          The exam was otherwise without abnormality. ?Complications:            No immediate complications. ?Estimated Blood Loss:     Estimated blood loss was minimal. ?Impression:               - One 10 mm polyp in the sigmoid colon, removed  ?                          with a hot snare. Resected and retrieved. ?                          - One 8 mm polyp in the sigmoid colon, removed with  ?                          a cold snare. Resected and  retrieved. ?                          - Diverticulosis in the sigmoid colon. ?                          - Non-bleeding external and internal hemorrhoids. ?                          - The examination was otherwise normal. ?Recommendation:           - Patient has a contact number available for  ?                          emergencies. The signs and symptoms of potential  ?                          delayed complications were discussed with the  ?                          patient. Return to  normal activities tomorrow.  ?                          Written discharge instructions were provided to the  ?                          patient. ?                          - Resume previous diet. ?                          - Continue present medications. ?                          - Await pathology results. ?                          - Repeat colonoscopy in 5-10 years for surveillance  ?                          based on pathology results. ?                          - See the other procedure note for documentation of  ?                          additional recommendations. ?Mauri Pole, MD ?01/05/2022 3:38:59 PM ?This report has been signed electronically. ?

## 2022-01-06 ENCOUNTER — Telehealth: Payer: Self-pay

## 2022-01-06 NOTE — Telephone Encounter (Signed)
Called the patient to schedule her capsule endoscopy. No answer. Left her a message on the voicemail of my call. ?

## 2022-01-07 ENCOUNTER — Telehealth: Payer: Self-pay

## 2022-01-07 NOTE — Telephone Encounter (Signed)
?  Follow up Call- ? ? ?  01/05/2022  ?  2:40 PM  ?Call back number  ?Post procedure Call Back phone  # [phone]  ?Permission to leave phone message Yes  ?  ? ?Patient questions: ? ?Do you have a fever, pain , or abdominal swelling? No. ?Pain Score  0 * ? ?Have you tolerated food without any problems? Yes.   ? ?Have you been able to return to your normal activities? Yes.   ? ?Do you have any questions about your discharge instructions: ?Diet   No. ?Medications  No. ?Follow up visit  No. ? ?Do you have questions or concerns about your Care? No. ? ?Actions: ?* If pain score is 4 or above: ?No action needed, pain <4. ? ? ?

## 2022-01-07 NOTE — Telephone Encounter (Signed)
Called [phone] and left a message we tried to reach pt for a follow up call. maw  ?

## 2022-01-11 NOTE — Telephone Encounter (Signed)
Called patient again. No answer. Left a message of my call.  ?

## 2022-01-13 ENCOUNTER — Other Ambulatory Visit: Payer: Self-pay

## 2022-01-13 NOTE — Telephone Encounter (Signed)
Letter mailed to the patient

## 2022-01-14 ENCOUNTER — Encounter: Payer: Self-pay | Admitting: Gastroenterology

## 2022-01-20 ENCOUNTER — Other Ambulatory Visit: Payer: Self-pay

## 2022-01-20 DIAGNOSIS — D649 Anemia, unspecified: Secondary | ICD-10-CM

## 2022-01-20 DIAGNOSIS — D509 Iron deficiency anemia, unspecified: Secondary | ICD-10-CM

## 2022-01-20 NOTE — Telephone Encounter (Signed)
Spoke with the patient. Scheduled for 01/31/22. Uses My Chart. She will review the instructions and call with any questions.  ?

## 2022-01-31 ENCOUNTER — Encounter: Payer: Self-pay | Admitting: Gastroenterology

## 2022-01-31 ENCOUNTER — Ambulatory Visit (INDEPENDENT_AMBULATORY_CARE_PROVIDER_SITE_OTHER): Payer: No Typology Code available for payment source | Admitting: Gastroenterology

## 2022-01-31 DIAGNOSIS — D500 Iron deficiency anemia secondary to blood loss (chronic): Secondary | ICD-10-CM

## 2022-01-31 DIAGNOSIS — D509 Iron deficiency anemia, unspecified: Secondary | ICD-10-CM

## 2022-01-31 DIAGNOSIS — D649 Anemia, unspecified: Secondary | ICD-10-CM | POA: Diagnosis not present

## 2022-01-31 NOTE — Progress Notes (Signed)
SN: [id number] Exp: 2023-06-11  LOT: [id number]  Patient arrived for VCE. Reported the prep went well. This RN explained capsule dietary restrictions for the next few hours. Pt advised to return at 4 pm to return capsule equipment.  Patient verbalized understanding. Opened capsule, ensured capsule was flashing prior to the patient swallowing the capsule. Patient swallowed capsule without difficulty.  Patient told to call the office with any questions and if capsule has not passed after 72 hours. No further questions by the conclusion of the visit.

## 2022-01-31 NOTE — Patient Instructions (Signed)
You may have clear liquids beginning at 10:30 am after ingesting the capsule.    You can have a light lunch at 12:30 pm; sandwich and half bowl of soup.  Return to the office at 4 pm to return the equipment.   Return to you normal diet at 5 pm.   Call [phone] and ask for Dyson Sevey, RN if you have any questions.  You should pass the capsule in your stool 8-48 hours after ingestion. If you have not passed the capsule, after 72 hours, please contact the office at [phone].    

## 2022-02-07 ENCOUNTER — Encounter: Payer: Self-pay | Admitting: Gastroenterology

## 2022-03-01 ENCOUNTER — Ambulatory Visit (INDEPENDENT_AMBULATORY_CARE_PROVIDER_SITE_OTHER): Payer: No Typology Code available for payment source | Admitting: Family Medicine

## 2022-03-01 ENCOUNTER — Encounter: Payer: Self-pay | Admitting: Family Medicine

## 2022-03-01 VITALS — BP 123/93 | HR 91 | Resp 16 | Wt 167.0 lb

## 2022-03-01 DIAGNOSIS — M519 Unspecified thoracic, thoracolumbar and lumbosacral intervertebral disc disorder: Secondary | ICD-10-CM

## 2022-03-01 DIAGNOSIS — D649 Anemia, unspecified: Secondary | ICD-10-CM

## 2022-03-01 DIAGNOSIS — D509 Iron deficiency anemia, unspecified: Secondary | ICD-10-CM

## 2022-03-01 DIAGNOSIS — M1990 Unspecified osteoarthritis, unspecified site: Secondary | ICD-10-CM

## 2022-03-01 DIAGNOSIS — I10 Essential (primary) hypertension: Secondary | ICD-10-CM | POA: Diagnosis not present

## 2022-03-01 DIAGNOSIS — E538 Deficiency of other specified B group vitamins: Secondary | ICD-10-CM | POA: Diagnosis not present

## 2022-03-01 DIAGNOSIS — F1020 Alcohol dependence, uncomplicated: Secondary | ICD-10-CM

## 2022-03-01 DIAGNOSIS — N951 Menopausal and female climacteric states: Secondary | ICD-10-CM

## 2022-03-01 MED ORDER — VITAMIN B-12 1000 MCG PO TABS: 1000.0000 ug | ORAL_TABLET | Freq: Every day | ORAL | 0 refills | Status: DC

## 2022-03-01 MED ORDER — FERROUS SULFATE 325 (65 FE) MG PO TBEC: 325.0000 mg | DELAYED_RELEASE_TABLET | Freq: Two times a day (BID) | ORAL | 3 refills | Status: DC

## 2022-03-01 NOTE — Progress Notes (Signed)
Established patient visit  I,[name],acting as a scribe for [name], MD.,have documented all relevant documentation on the behalf of [name], MD,as directed by  [name], MD while in the presence of [name], MD.   Patient: Alicia Rice   DOB: 02/15/1961   61 y.o. Female  MRN: [id number] Visit Date: 03/01/2022  Today's healthcare provider: [name], MD   No chief complaint on file.  Subjective    HPI  Patient is feeling better since her transfusion for anemia.  She underwent a capsule endoscopy on May 15.  She still has not heard the results.  She continues to take twice daily iron.  She tolerates this well.  Home blood pressure today was 119/78.  Hypertension, follow-up  BP Readings from Last 3 Encounters:  01/05/22 130/65  12/21/21 120/84  12/06/21 (!) 150/89   Wt Readings from Last 3 Encounters:  01/05/22 164 lb (74.4 kg)  12/21/21 164 lb (74.4 kg)  12/06/21 165 lb (74.8 kg)     She was last seen for hypertension 3 months ago.  Management since that visit includes; Added telmisartan 20 mg daily.   Outside blood pressures are checks occasionally.  ---------------------------------------------------------------------------------------------------   Medications: Outpatient Medications Prior to Visit  Medication Sig   adapalene (DIFFERIN) 0.1 % gel Apply 1 application. topically at bedtime.   amLODipine (NORVASC) 5 MG tablet Take 1 tablet (5 mg total) by mouth daily.   amphetamine-dextroamphetamine (ADDERALL) 20 MG tablet Take 20 mg by mouth 3 (three) times daily.    cyclobenzaprine (FLEXERIL) 10 MG tablet Take 2 tablets (20 mg total) by mouth at bedtime.   doxepin (SINEQUAN) 10 MG capsule Take 30 mg by mouth at bedtime.   DULoxetine (CYMBALTA) 60 MG capsule Take 120 mg by mouth every evening.   famotidine (PEPCID) 20 MG tablet Take 20 mg by mouth 2 (two) times daily as needed for heartburn or  indigestion.   ferrous sulfate 325 (65 FE) MG EC tablet Take 1 tablet (325 mg total) by mouth 2 (two) times daily.   FLUoxetine (PROZAC) 20 MG capsule Take 20 mg by mouth every evening.   fluticasone (FLONASE) 50 MCG/ACT nasal spray Place 2 sprays into both nostrils daily.   gabapentin (NEURONTIN) 600 MG tablet Take 2,400 mg by mouth at bedtime.   metroNIDAZOLE (METROCREAM) 0.75 % cream Apply 1 application. topically daily.   telmisartan (MICARDIS) 20 MG tablet Take 1 tablet (20 mg total) by mouth daily.   vitamin B-12 (CYANOCOBALAMIN) 1000 MCG tablet Take 1 tablet (1,000 mcg total) by mouth daily.   No facility-administered medications prior to visit.    Review of Systems  Constitutional:  Negative for appetite change, chills, fatigue and fever.  Respiratory:  Negative for chest tightness and shortness of breath.   Cardiovascular:  Negative for chest pain and palpitations.  Gastrointestinal:  Negative for abdominal pain, nausea and vomiting.  Neurological:  Negative for dizziness and weakness.    Last CBC Lab Results  Component Value Date   WBC 8.7 03/01/2022   HGB 14.1 03/01/2022   HCT 41.9 03/01/2022   MCV 91 03/01/2022   MCH 30.7 03/01/2022   RDW 17.0 (H) 03/01/2022   PLT 316 03/01/2022       Objective    There were no vitals taken for this visit. BP Readings from Last 3 Encounters:  03/01/22 (!) 123/93  01/05/22 130/65  12/21/21 120/84   Wt Readings from  Last 3 Encounters:  03/01/22 167 lb (75.8 kg)  01/05/22 164 lb (74.4 kg)  12/21/21 164 lb (74.4 kg)      Physical Exam Vitals reviewed.  HENT:     Head: Normocephalic and atraumatic.  Eyes:     General: No scleral icterus. Cardiovascular:     Rate and Rhythm: Normal rate and regular rhythm.     Heart sounds: Normal heart sounds.  Pulmonary:     Breath sounds: Normal breath sounds.  Abdominal:     Palpations: Abdomen is soft.  Skin:    General: Skin is warm and dry.  Neurological:     Mental Status:  She is alert and oriented to person, place, and time.  Psychiatric:        Mood and Affect: Mood normal.        Behavior: Behavior normal.       No results found for any visits on 03/01/22.  Assessment & Plan     1. Primary hypertension Continue to follow home blood pressure readings. - CBC w/Diff/Platelet - Iron, TIBC and Ferritin Panel - Renal function panel  2. Symptomatic anemia  - CBC w/Diff/Platelet - Iron, TIBC and Ferritin Panel - Renal function panel - ferrous sulfate 325 (65 FE) MG EC tablet; Take 1 tablet (325 mg total) by mouth 2 (two) times daily.  Dispense: 60 tablet; Refill: 3  3. Microcytic anemia If CBC back to normal and iron stores back normal we will cut down to daily iron and possibly discontinue on next visit. - CBC w/Diff/Platelet - Iron, TIBC and Ferritin Panel - Renal function panel - ferrous sulfate 325 (65 FE) MG EC tablet; Take 1 tablet (325 mg total) by mouth 2 (two) times daily.  Dispense: 60 tablet; Refill: 3  4. B12 deficiency  - vitamin B-12 (CYANOCOBALAMIN) 1000 MCG tablet; Take 1 tablet (1,000 mcg total) by mouth daily.  Dispense: 30 tablet; Refill: 0  5. Arthritis   6. Thoracic, thoracolumbar and lumbosacral intervertebral disc disorder   7. Alcoholism [facility]) Patient has been sober for several years.  8. Menopausal symptom    No follow-ups on file.      I, [name], MD, have reviewed all documentation for this visit. The documentation on 03/07/22 for the exam, diagnosis, procedures, and orders are all accurate and complete.    Brittiny Levitz Cranford Mon, MD  [facility] [phone] (phone) [phone] (fax)  [place]

## 2022-03-02 LAB — CBC WITH DIFFERENTIAL/PLATELET
Basophils Absolute: 0.1 10*3/uL (ref 0.0–0.2)
Basos: 1 %
EOS (ABSOLUTE): 0.1 10*3/uL (ref 0.0–0.4)
Eos: 1 %
Hematocrit: 41.9 % (ref 34.0–46.6)
Hemoglobin: 14.1 g/dL (ref 11.1–15.9)
Immature Grans (Abs): 0 10*3/uL (ref 0.0–0.1)
Immature Granulocytes: 0 %
Lymphocytes Absolute: 2 10*3/uL (ref 0.7–3.1)
Lymphs: 23 %
MCH: 30.7 pg (ref 26.6–33.0)
MCHC: 33.7 g/dL (ref 31.5–35.7)
MCV: 91 fL (ref 79–97)
Monocytes Absolute: 0.5 10*3/uL (ref 0.1–0.9)
Monocytes: 6 %
Neutrophils Absolute: 6.1 10*3/uL (ref 1.4–7.0)
Neutrophils: 69 %
Platelets: 316 10*3/uL (ref 150–450)
RBC: 4.6 x10E6/uL (ref 3.77–5.28)
RDW: 17 % — ABNORMAL HIGH (ref 11.7–15.4)
WBC: 8.7 10*3/uL (ref 3.4–10.8)

## 2022-03-02 LAB — IRON,TIBC AND FERRITIN PANEL
Ferritin: 40 ng/mL (ref 15–150)
Iron Saturation: 16 % (ref 15–55)
Iron: 58 ug/dL (ref 27–159)
Total Iron Binding Capacity: 370 ug/dL (ref 250–450)
UIBC: 312 ug/dL (ref 131–425)

## 2022-03-02 LAB — RENAL FUNCTION PANEL
Albumin: 5 g/dL — ABNORMAL HIGH (ref 3.8–4.9)
BUN/Creatinine Ratio: 16 (ref 12–28)
BUN: 12 mg/dL (ref 8–27)
CO2: 24 mmol/L (ref 20–29)
Calcium: 10.1 mg/dL (ref 8.7–10.3)
Chloride: 98 mmol/L (ref 96–106)
Creatinine, Ser: 0.76 mg/dL (ref 0.57–1.00)
Glucose: 110 mg/dL — ABNORMAL HIGH (ref 70–99)
Phosphorus: 4.6 mg/dL — ABNORMAL HIGH (ref 3.0–4.3)
Potassium: 4.3 mmol/L (ref 3.5–5.2)
Sodium: 138 mmol/L (ref 134–144)
eGFR: 90 mL/min/{1.73_m2} (ref >59)

## 2022-03-27 ENCOUNTER — Other Ambulatory Visit: Payer: Self-pay | Admitting: Family Medicine

## 2022-03-31 ENCOUNTER — Other Ambulatory Visit: Payer: Self-pay | Admitting: Family Medicine

## 2022-03-31 ENCOUNTER — Telehealth: Payer: Self-pay

## 2022-03-31 DIAGNOSIS — E538 Deficiency of other specified B group vitamins: Secondary | ICD-10-CM

## 2022-03-31 NOTE — Telephone Encounter (Signed)
Called the patient to advise of the capsule study findings. See the report under "procedures" for details. Advised no findings to explain the IDA. Routing copy of report to her PCP. Follow up with PCP.

## 2022-05-13 ENCOUNTER — Ambulatory Visit: Payer: Self-pay

## 2022-05-13 ENCOUNTER — Other Ambulatory Visit: Payer: Self-pay | Admitting: *Deleted

## 2022-05-13 DIAGNOSIS — D649 Anemia, unspecified: Secondary | ICD-10-CM

## 2022-05-13 NOTE — Telephone Encounter (Signed)
Please advise 

## 2022-05-13 NOTE — Telephone Encounter (Signed)
Labs ordered. Patient was advised.  

## 2022-05-13 NOTE — Telephone Encounter (Signed)
    Chief Complaint: Feeling tired, cold, finds herself taking deep breaths, but not SOB. History of anemia Symptoms: Above Frequency: 2 weeks Pertinent Negatives: Patient denies any other symptoms Disposition: '[]'$ ED /'[]'$ Urgent Care (no appt availability in office) / '[]'$ Appointment(In office/virtual)/ '[]'$  [place] [facility]/ '[]'$ Home Care/ '[]'$ Refused Recommended Disposition /'[]'$ [place] [facility]/ '[x]'$  Follow-up with PCP Additional Notes: Wants to know if PCP wants to see her sooner or do lab work. Please advise pt.  Answer Assessment - Initial Assessment Questions 1. RESPIRATORY STATUS: "Describe your breathing?" (e.g., wheezing, shortness of breath, unable to speak, severe coughing)      Deep breathing 2. ONSET: "When did this breathing problem begin?"      2 weeks 3. PATTERN "Does the difficult breathing come and go, or has it been constant since it started?"      Comes and goes 4. SEVERITY: "How bad is your breathing?" (e.g., mild, moderate, severe)    - MILD: No SOB at rest, mild SOB with walking, speaks normally in sentences, can lie down, no retractions, pulse < 100.    - MODERATE: SOB at rest, SOB with minimal exertion and prefers to sit, cannot lie down flat, speaks in phrases, mild retractions, audible wheezing, pulse 100-120.    - SEVERE: Very SOB at rest, speaks in single words, struggling to breathe, sitting hunched forward, retractions, pulse > 120      Mild 5. RECURRENT SYMPTOM: "Have you had difficulty breathing before?" If Yes, ask: "When was the last time?" and "What happened that time?"      Yes 6. CARDIAC HISTORY: "Do you have any history of heart disease?" (e.g., heart attack, angina, bypass surgery, angioplasty)      Has anemia 7. LUNG HISTORY: "Do you have any history of lung disease?"  (e.g., pulmonary embolus, asthma, emphysema)     No 8. CAUSE: "What do you think is causing the breathing problem?"      Anemia 9. OTHER SYMPTOMS: "Do you have any other symptoms?  (e.g., dizziness, runny nose, cough, chest pain, fever)     No 10. O2 SATURATION MONITOR:  "Do you use an oxygen saturation monitor (pulse oximeter) at home?" If Yes, ask: "What is your reading (oxygen level) today?" "What is your usual oxygen saturation reading?" (e.g., 95%)       No 11. PREGNANCY: "Is there any chance you are pregnant?" "When was your last menstrual period?"       No 12. TRAVEL: "Have you traveled out of the country in the last month?" (e.g., travel history, exposures)       No  Protocols used: Breathing Difficulty-A-AH

## 2022-05-18 LAB — CBC WITH DIFFERENTIAL/PLATELET
Basophils Absolute: 0.1 10*3/uL (ref 0.0–0.2)
Basos: 1 %
EOS (ABSOLUTE): 0.2 10*3/uL (ref 0.0–0.4)
Eos: 2 %
Hematocrit: 39 % (ref 34.0–46.6)
Hemoglobin: 13.6 g/dL (ref 11.1–15.9)
Immature Grans (Abs): 0 10*3/uL (ref 0.0–0.1)
Immature Granulocytes: 0 %
Lymphocytes Absolute: 2.6 10*3/uL (ref 0.7–3.1)
Lymphs: 30 %
MCH: 32.2 pg (ref 26.6–33.0)
MCHC: 34.9 g/dL (ref 31.5–35.7)
MCV: 92 fL (ref 79–97)
Monocytes Absolute: 0.8 10*3/uL (ref 0.1–0.9)
Monocytes: 9 %
Neutrophils Absolute: 4.9 10*3/uL (ref 1.4–7.0)
Neutrophils: 58 %
Platelets: 347 10*3/uL (ref 150–450)
RBC: 4.23 x10E6/uL (ref 3.77–5.28)
RDW: 13.2 % (ref 11.7–15.4)
WBC: 8.5 10*3/uL (ref 3.4–10.8)

## 2022-05-18 LAB — IRON,TIBC AND FERRITIN PANEL
Ferritin: 46 ng/mL (ref 15–150)
Iron Saturation: 38 % (ref 15–55)
Iron: 118 ug/dL (ref 27–139)
Total Iron Binding Capacity: 307 ug/dL (ref 250–450)
UIBC: 189 ug/dL (ref 118–369)

## 2022-05-27 NOTE — Progress Notes (Signed)
Complete physical exam  I,Alicia Rice,acting as a scribe for Alicia Durie, MD.,have documented all relevant documentation on the behalf of Alicia Durie, MD,as directed by  Alicia Durie, MD while in the presence of Alicia Durie, MD.   Patient: Alicia Rice   DOB: 1960-10-17   61 y.o. Female  MRN: [id number] Visit Date: 05/30/2022  Today's healthcare provider: [name], MD   Chief Complaint  Patient presents with   Annual Exam   Subjective    Alicia Rice is a 61 y.o. female who presents today for a complete physical exam.  She reports consuming a general diet. The patient does not participate in regular exercise at present. She generally feels well. She reports sleeping fairly well. She does not have additional problems to discuss today.  She feels well recently.  Energy level is back after treatment for anemia HPI    Past Medical History:  Diagnosis Date   Acute carpal tunnel syndrome of left wrist 07/07/2017   Arthritis    lt knee   Chronic back pain    Closed fracture of left distal radius 07/07/2017   Depression    Distal radius fracture, left    GERD (gastroesophageal reflux disease)    Hypertension    Loose, teeth    bottom front   PONV (postoperative nausea and vomiting)    Past Surgical History:  Procedure Laterality Date   CARPAL TUNNEL RELEASE Left 07/07/2017   Procedure: LEFT CARPAL TUNNEL RELEASE;  Surgeon: Marchia Bond, MD;  Location: [place];  Service: Orthopedics;  Laterality: Left;   DILATION AND CURETTAGE OF UTERUS     ELBOW SURGERY     nerve surgery   FOOT SURGERY     2011 & fusion of bones 2016   HYSTEROSCOPY     MANDIBLE FRACTURE SURGERY     NASAL SINUS SURGERY     OPEN REDUCTION INTERNAL FIXATION (ORIF) DISTAL RADIAL FRACTURE Left 07/07/2017   Procedure: OPEN REDUCTION INTERNAL FIXATION (ORIF) LEFT DISTAL RADIAL FRACTURE;  Surgeon: Marchia Bond, MD;  Location: [place];  Service: Orthopedics;  Laterality: Left;   PARTIAL KNEE ARTHROPLASTY Left 11/28/2017   Procedure: UNICOMPARTMENTAL KNEE;  Surgeon: Marchia Bond, MD;  Location: [place];  Service: Orthopedics;  Laterality: Left;   TEMPOROMANDIBULAR JOINT SURGERY     TONSILLECTOMY     Social History   Socioeconomic History   Marital status: Married    Spouse name: Not on file   Number of children: Not on file   Years of education: Not on file   Highest education level: Not on file  Occupational History   Not on file  Tobacco Use   Smoking status: Former    Packs/day: 0.25    Types: Cigarettes    Quit date: 07/03/2017    Years since quitting: 4.9   Smokeless tobacco: Never  Vaping Use   Vaping Use: Never used  Substance and Sexual Activity   Alcohol use: No   Drug use: No   Sexual activity: Not on file  Other Topics Concern   Not on file  Social History Narrative   Not on file   Social Determinants of Health   Financial Resource Strain: Not on file  Food Insecurity: Not on file  Transportation Needs: Not on file  Physical Activity: Not on file  Stress: Not on file  Social Connections: Not on file  Intimate Partner Violence: Not on file  Family Status  Relation Name Status   Sister  Alive   Mother  (Not Specified)   Father  (Not Specified)   Family History  Problem Relation Age of Onset   Heart murmur Mother    Hypertension Father    Diabetes Father    Arthritis Father    Prostate cancer Father    [name]' disease Father    No Known Allergies  Patient Care Team: [name], MD as PCP - General (Unknown Physician Specialty)   Medications: Outpatient Medications Prior to Visit  Medication Sig   adapalene (DIFFERIN) 0.1 % gel Apply 1 application. topically at bedtime.   amLODipine (NORVASC) 5 MG tablet Take 1 tablet (5 mg total) by mouth daily.   amphetamine-dextroamphetamine (ADDERALL) 20 MG tablet Take 20 mg by mouth 3 (three) times daily.     cyclobenzaprine (FLEXERIL) 10 MG tablet Take 2 tablets (20 mg total) by mouth at bedtime.   doxepin (SINEQUAN) 10 MG capsule 1-3 BY MOUTH EVERY NIGHT   doxycycline (VIBRAMYCIN) 100 MG capsule Take 100 mg by mouth daily.   DULoxetine (CYMBALTA) 60 MG capsule Take 120 mg by mouth every evening.   famotidine (PEPCID) 20 MG tablet Take 20 mg by mouth 2 (two) times daily as needed for heartburn or indigestion.   ferrous sulfate 325 (65 FE) MG EC tablet Take 1 tablet (325 mg total) by mouth 2 (two) times daily.   FLUoxetine (PROZAC) 20 MG capsule Take 20 mg by mouth every evening.   fluticasone (FLONASE) 50 MCG/ACT nasal spray Place 2 sprays into both nostrils daily.   gabapentin (NEURONTIN) 600 MG tablet Take 2,400 mg by mouth at bedtime.   metroNIDAZOLE (METROCREAM) 0.75 % cream Apply 1 application. topically daily.   telmisartan (MICARDIS) 20 MG tablet Take 1 tablet (20 mg total) by mouth daily.   vitamin B-12 (CYANOCOBALAMIN) 1000 MCG tablet TAKE 1 TABLET BY MOUTH EVERY DAY   [DISCONTINUED] triamcinolone (KENALOG) 0.025 % cream Apply topically 2 (two) times daily. (Patient not taking: Reported on 05/30/2022)   No facility-administered medications prior to visit.    Review of Systems  All other systems reviewed and are negative.   Last CBC Lab Results  Component Value Date   WBC 8.5 05/17/2022   HGB 13.6 05/17/2022   HCT 39.0 05/17/2022   MCV 92 05/17/2022   MCH 32.2 05/17/2022   RDW 13.2 05/17/2022   PLT 347 05/17/2022      Objective     BP (!) 151/90 (BP Location: Right Arm, Patient Position: Sitting, Cuff Size: Normal)   Pulse 85   Resp 16   Ht '5\' 5"'$  (1.651 m)   Wt 172 lb (78 kg)   SpO2 98%   BMI 28.62 kg/m  BP Readings from Last 3 Encounters:  05/30/22 (!) 151/90  03/01/22 (!) 123/93  01/05/22 130/65   Wt Readings from Last 3 Encounters:  05/30/22 172 lb (78 kg)  03/01/22 167 lb (75.8 kg)  01/05/22 164 lb (74.4 kg)       Physical Exam Vitals reviewed.  HENT:      Head: Normocephalic and atraumatic.  Eyes:     General: No scleral icterus. Cardiovascular:     Rate and Rhythm: Normal rate and regular rhythm.     Heart sounds: Normal heart sounds.  Pulmonary:     Breath sounds: Normal breath sounds.  Abdominal:     Palpations: Abdomen is soft.  Skin:    General: Skin is warm and  dry.  Neurological:     Mental Status: She is alert and oriented to person, place, and time.  Psychiatric:        Mood and Affect: Mood normal.        Behavior: Behavior normal.       Last depression screening scores    11/29/2021    2:38 PM  PHQ 2/9 Scores  PHQ - 2 Score 2  PHQ- 9 Score 8   Last fall risk screening    11/29/2021    2:38 PM  Fall Risk   Falls in the past year? 1  Number falls in past yr: 0  Injury with Fall? 0  Risk for fall due to : History of fall(s);Impaired balance/gait  Follow up Falls evaluation completed   Last Audit-C alcohol use screening    11/29/2021    2:38 PM  Alcohol Use Disorder Test (AUDIT)  1. How often do you have a drink containing alcohol? 0  2. How many drinks containing alcohol do you have on a typical day when you are drinking? 0  3. How often do you have six or more drinks on one occasion? 0  AUDIT-C Score 0   A score of 3 or more in women, and 4 or more in men indicates increased risk for alcohol abuse, EXCEPT if all of the points are from question 1   No results found for any visits on 05/30/22.  Assessment & Plan    Routine Health Maintenance and Physical Exam  Exercise Activities and Dietary recommendations  Goals   None     Immunization History  Administered Date(s) Administered   Influenza,inj,Quad PF,6+ Mos 06/10/2014, 06/26/2019, 12/02/2021   Tdap 04/17/2012    Health Maintenance  Topic Date Due   COVID-19 Vaccine (1) Never done   Hepatitis C Screening  Never done   PAP SMEAR-Modifier  Never done   MAMMOGRAM  Never done   Zoster Vaccines- Shingrix (1 of 2) Never done    TETANUS/TDAP  04/17/2022   INFLUENZA VACCINE  04/19/2022   COLONOSCOPY (Pts 45-43yr Insurance coverage will need to be confirmed)  01/05/2025   HIV Screening  Completed   HPV VACCINES  Aged Out    Discussed health benefits of physical activity, and encouraged her to engage in regular exercise appropriate for her age and condition.  1. Annual physical exam Patient needs to update mammogram.  Colonoscopy done along with EGD earlier this year for anemia  2. Need for influenza vaccination  - Flu Vaccine QUAD 65moM (Fluarix, Fluzone & Alfiuria Quad PF)  3. Need for Tdap vaccination  - Tdap vaccine greater than or equal to 7yo IM  4. Need for shingles vaccine  - Zoster Recombinant (Shingrix )  5. Microcytic anemia Follow-up CBC again in a few months  6. B12 deficiency   7. Primary hypertension Bring in home blood pressure readings on next visit  8. Arthritis   9. Thoracic, thoracolumbar and lumbosacral intervertebral disc disorder Patient with chronic pain syndrome  10. Major depressive disorder in partial remission, unspecified whether recurrent (HCPioneer JunctionFollowed by psychiatry and under control  11. Anxiety, generalized Also with alcoholism she has been sober for several years.  12. Attention deficit hyperactivity disorder (ADHD), predominantly inattentive type    No follow-ups on file.     I, RiWilhemena DurieMD, have reviewed all documentation for this visit. The documentation on 06/02/22 for the exam, diagnosis, procedures, and orders are all accurate and complete.  Khaliel Morey Cranford Mon, MD  [facility] [phone] (phone) [phone] (fax)  [place]

## 2022-05-30 ENCOUNTER — Encounter: Payer: Self-pay | Admitting: Family Medicine

## 2022-05-30 ENCOUNTER — Ambulatory Visit (INDEPENDENT_AMBULATORY_CARE_PROVIDER_SITE_OTHER): Payer: No Typology Code available for payment source | Admitting: Family Medicine

## 2022-05-30 VITALS — BP 151/90 | HR 85 | Resp 16 | Ht 65.0 in | Wt 172.0 lb

## 2022-05-30 DIAGNOSIS — F411 Generalized anxiety disorder: Secondary | ICD-10-CM

## 2022-05-30 DIAGNOSIS — E538 Deficiency of other specified B group vitamins: Secondary | ICD-10-CM | POA: Diagnosis not present

## 2022-05-30 DIAGNOSIS — Z23 Encounter for immunization: Secondary | ICD-10-CM | POA: Diagnosis not present

## 2022-05-30 DIAGNOSIS — F900 Attention-deficit hyperactivity disorder, predominantly inattentive type: Secondary | ICD-10-CM

## 2022-05-30 DIAGNOSIS — Z0000 Encounter for general adult medical examination without abnormal findings: Secondary | ICD-10-CM

## 2022-05-30 DIAGNOSIS — M1990 Unspecified osteoarthritis, unspecified site: Secondary | ICD-10-CM

## 2022-05-30 DIAGNOSIS — D509 Iron deficiency anemia, unspecified: Secondary | ICD-10-CM

## 2022-05-30 DIAGNOSIS — F324 Major depressive disorder, single episode, in partial remission: Secondary | ICD-10-CM

## 2022-05-30 DIAGNOSIS — I10 Essential (primary) hypertension: Secondary | ICD-10-CM

## 2022-05-30 DIAGNOSIS — M519 Unspecified thoracic, thoracolumbar and lumbosacral intervertebral disc disorder: Secondary | ICD-10-CM

## 2022-06-15 ENCOUNTER — Other Ambulatory Visit: Payer: Self-pay | Admitting: Family Medicine

## 2022-06-15 DIAGNOSIS — D509 Iron deficiency anemia, unspecified: Secondary | ICD-10-CM

## 2022-06-15 DIAGNOSIS — D649 Anemia, unspecified: Secondary | ICD-10-CM

## 2022-06-15 NOTE — Telephone Encounter (Signed)
Requested medication (s) are due for refill today: yes  Requested medication (s) are on the active medication list: yes  Last refill:  03/01/22 #60 with 3 RF  Future visit scheduled: no, CPE 05/30/22  Notes to clinic:  Pt of Dr Rosanna Randy, no transfer of care mentioned, please assess.      Requested Prescriptions  Pending Prescriptions Disp Refills   ferrous sulfate 325 (65 FE) MG EC tablet [Pharmacy Med Name: FERROUS SULF EC 325 MG TABLET] 180 tablet 3    Sig: TAKE 1 TABLET BY MOUTH TWICE A DAY     Endocrinology:  Minerals - Iron Supplementation Passed - 06/15/2022  5:34 PM      Passed - HGB in normal range and within 360 days    Hemoglobin  Date Value Ref Range Status  05/17/2022 13.6 11.1 - 15.9 g/dL Final         Passed - HCT in normal range and within 360 days    Hematocrit  Date Value Ref Range Status  05/17/2022 39.0 34.0 - 46.6 % Final         Passed - RBC in normal range and within 360 days    RBC  Date Value Ref Range Status  05/17/2022 4.23 3.77 - 5.28 x10E6/uL Final  12/21/2021 4.66 3.87 - 5.11 Mil/uL Final         Passed - Fe (serum) in normal range and within 360 days    Iron  Date Value Ref Range Status  05/17/2022 118 27 - 139 ug/dL Final   Iron Saturation  Date Value Ref Range Status  05/17/2022 38 15 - 55 % Final         Passed - Ferritin in normal range and within 360 days    Ferritin  Date Value Ref Range Status  05/17/2022 46 15 - 150 ng/mL Final         Passed - Valid encounter within last 12 months    Recent Outpatient Visits           2 weeks ago Annual physical exam   [facility] [name], MD   3 months ago Primary hypertension   [facility] [name], MD   6 months ago Symptomatic anemia   [facility] [name], MD   6 months ago Major depressive disorder in partial remission, unspecified whether recurrent [facility])   [facility]  [name], MD   2 years ago Lymphadenitis   [facility] [name], MD

## 2022-07-05 ENCOUNTER — Ambulatory Visit: Payer: No Typology Code available for payment source | Admitting: Family Medicine

## 2024-08-29 ENCOUNTER — Other Ambulatory Visit: Payer: Self-pay | Admitting: Family Medicine

## 2024-08-29 DIAGNOSIS — Z1231 Encounter for screening mammogram for malignant neoplasm of breast: Secondary | ICD-10-CM
